# Patient Record
Sex: Female | Born: 1985 | Race: Black or African American | Hispanic: No | Marital: Single | Smoking: Current every day smoker
Health system: Southern US, Community
[De-identification: ages and names within clinical notes are randomized; demographics above are authoritative.]

## PROBLEM LIST (undated history)

## (undated) DIAGNOSIS — N809 Endometriosis, unspecified: Secondary | ICD-10-CM

## (undated) DIAGNOSIS — D649 Anemia, unspecified: Secondary | ICD-10-CM

## (undated) DIAGNOSIS — Z8669 Personal history of other diseases of the nervous system and sense organs: Secondary | ICD-10-CM

## (undated) DIAGNOSIS — M069 Rheumatoid arthritis, unspecified: Secondary | ICD-10-CM

## (undated) HISTORY — PX: OVARIAN CYST SURGERY: SHX726

## (undated) HISTORY — DX: Anemia, unspecified: D64.9

## (undated) HISTORY — PX: WISDOM TOOTH EXTRACTION: SHX21

## (undated) HISTORY — DX: Rheumatoid arthritis, unspecified: M06.9

## (undated) HISTORY — DX: Personal history of other diseases of the nervous system and sense organs: Z86.69

---
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
## 2006-02-19 ENCOUNTER — Emergency Department (HOSPITAL_COMMUNITY): Admission: EM | Admit: 2006-02-19 | Discharge: 2006-02-19 | Payer: Self-pay | Admitting: *Deleted

## 2006-02-28 ENCOUNTER — Emergency Department (HOSPITAL_COMMUNITY): Admission: EM | Admit: 2006-02-28 | Discharge: 2006-02-28 | Payer: Self-pay | Admitting: Family Medicine

## 2006-03-01 ENCOUNTER — Emergency Department (HOSPITAL_COMMUNITY): Admission: EM | Admit: 2006-03-01 | Discharge: 2006-03-01 | Payer: Self-pay | Admitting: Emergency Medicine

## 2006-03-02 ENCOUNTER — Inpatient Hospital Stay (HOSPITAL_COMMUNITY): Admission: AD | Admit: 2006-03-02 | Discharge: 2006-03-02 | Payer: Self-pay | Admitting: Gynecology

## 2006-05-27 ENCOUNTER — Inpatient Hospital Stay (HOSPITAL_COMMUNITY): Admission: AD | Admit: 2006-05-27 | Discharge: 2006-05-27 | Payer: Self-pay | Admitting: Obstetrics & Gynecology

## 2006-06-24 ENCOUNTER — Encounter (INDEPENDENT_AMBULATORY_CARE_PROVIDER_SITE_OTHER): Payer: Self-pay | Admitting: *Deleted

## 2007-03-03 ENCOUNTER — Ambulatory Visit (HOSPITAL_COMMUNITY): Admission: RE | Admit: 2007-03-03 | Discharge: 2007-03-03 | Payer: Self-pay | Admitting: Obstetrics and Gynecology

## 2007-09-01 ENCOUNTER — Emergency Department (HOSPITAL_COMMUNITY): Admission: EM | Admit: 2007-09-01 | Discharge: 2007-09-01 | Payer: Self-pay | Admitting: Emergency Medicine

## 2007-10-11 ENCOUNTER — Emergency Department (HOSPITAL_COMMUNITY): Admission: EM | Admit: 2007-10-11 | Discharge: 2007-10-11 | Payer: Self-pay | Admitting: Emergency Medicine

## 2008-05-31 ENCOUNTER — Other Ambulatory Visit: Admission: RE | Admit: 2008-05-31 | Discharge: 2008-05-31 | Payer: Self-pay | Admitting: Obstetrics and Gynecology

## 2009-09-28 ENCOUNTER — Emergency Department (HOSPITAL_COMMUNITY): Admission: EM | Admit: 2009-09-28 | Discharge: 2009-09-28 | Payer: Self-pay | Admitting: Family Medicine

## 2010-03-16 ENCOUNTER — Emergency Department (HOSPITAL_BASED_OUTPATIENT_CLINIC_OR_DEPARTMENT_OTHER): Admission: EM | Admit: 2010-03-16 | Discharge: 2010-03-17 | Payer: Self-pay | Admitting: Emergency Medicine

## 2010-05-13 LAB — CBC
HCT: 32.6 % — ABNORMAL LOW (ref 36.0–46.0)
Hemoglobin: 11.6 g/dL — ABNORMAL LOW (ref 12.0–15.0)
MCH: 29.4 pg (ref 26.0–34.0)
MCHC: 35.6 g/dL (ref 30.0–36.0)
MCV: 82.5 fL (ref 78.0–100.0)
Platelets: 243 10*3/uL (ref 150–400)
RBC: 3.95 MIL/uL (ref 3.87–5.11)
RDW: 13.8 % (ref 11.5–15.5)
WBC: 8.8 10*3/uL (ref 4.0–10.5)

## 2010-05-13 LAB — SURGICAL PCR SCREEN
MRSA, PCR: NEGATIVE
Staphylococcus aureus: NEGATIVE

## 2010-05-20 ENCOUNTER — Ambulatory Visit (HOSPITAL_COMMUNITY)
Admission: RE | Admit: 2010-05-20 | Discharge: 2010-05-20 | Payer: Self-pay | Source: Home / Self Care | Attending: Obstetrics and Gynecology | Admitting: Obstetrics and Gynecology

## 2010-05-20 LAB — HCG, QUANTITATIVE, PREGNANCY: hCG, Beta Chain, Quant, S: <2 m[IU]/mL (ref <5)

## 2010-06-03 NOTE — H&P (Signed)
[name], [name]             ACCOUNT NO.:  [id number]  MEDICAL RECORD NO.:  [id number]        PATIENT TYPE:  WAMB  LOCATION:                                [facility]:  [facility]  PHYSICIAN:  Lovetta Condie A. Jodel Mayhall, M.D. DATE OF BIRTH:  16-Apr-1986  DATE OF ADMISSION:  05/20/2010 DATE OF DISCHARGE:                             HISTORY & PHYSICAL   HISTORY OF PRESENT ILLNESS:  [name] is a 25 year old single black female, para 1-0-0-1, presenting for a laparoscopic ovarian cystectomy with the possibility of oophorectomy because of persistent pelvic pain and 2 complex ovarian cysts seen on ultrasound in the left adnexa.  For the past 9 months the patient reports daily crampy pelvic pain that on occasion will be sharp and at other times burning in sensation.  There are times when the intensity increases particularly  when she coughs or sneezes rating her pain as 10/10 on a 10-point pain scale. She found some relief, early on, with ibuprofen 800 mg that decreased her pain to 6/10 on a 10-point pain scale.  The patient reports that in the past she has been told she had endometriosis but has not had any previous surgery to document that fact.  She denies any urinary tract symptoms, changes in her bowel movements, vaginitis symptoms or fever.  A pelvic ultrasound in November 2011, showed a uterus measuring 9.43 x 5.69 x 5.31 cm with 2 the left ovarian cysts, the first appearing hemorrhagic measuring 5.29 x 4.64 x 4.66 cm, and the second a simple cyst measuring 3.63 x 3.10 x 3.46 cm.  The patient was also observed to have a right ovarian follicle on that study measuring 2.29 x 1.91 x 2.26 cm.  The patient was prescribed in November 2011, Toradol 10 mg for her pain.  She returned for followup visit in January 2012, to say that her pain remained the same though it was relieved for approximately 6 hours whenever she would take the Toradol.  A repeat ultrasound at that time showed that the  previously identified hemorrhagic cyst now had the appearance of a dermoid measuring 5.93 x 3.74 x 4.93 cm and the cyst had previously appeared simple, now had the appearance of an endometrioma measuring 4.83 x 3.04 x 4.07 cm.  The patient's right ovary was normal on this study.  Additionally, there was no increase blood flow with color flow Doppler to either ovary.  A review of management options were given to the patient regarding her ultrasound findings as well as pain management.  However, she has decided due to the intensity of her pain and the persistence of the  ovarian cysts to proceed with operative intervention.  PAST MEDICAL HISTORY:  OBSTETRICAL HISTORY:  Gravida 1, para 1-0-0-1.  GYNECOLOGICAL HISTORY:  Menarche at 12 years.  Last menstrual period on May 04, 2010.  The patient uses Camila for contraception.  She has a remote history of Chlamydia.  No history of abnormal Pap smears.  Last normal Pap smear was in September 2011.  MEDICAL HISTORY:  Migraines and anemia.  SURGICAL HISTORY:  Negative.  FAMILY HISTORY:  Colon cancer, hypertension, stroke, migraines, and thyroid  disease.  SOCIAL HISTORY:  The patient is single.  HABITS:  She smokes one half-pack of cigarettes per day.  Denies any alcohol or illicit drug use.  CURRENT MEDICATIONS: 1. Toradol 10 mg every 6 hours as needed for pain. 2. Camila 1 tablet daily.  ALLERGIES:  She has no known drug allergies.  REVIEW OF SYSTEMS:  The patient denies any chest pain, shortness of breath, vision changes, headache (except with migraines), myalgias, arthralgias, skin rashes and except as is mentioned in history of present illness, the patient's review of systems is otherwise negative.  PHYSICAL EXAMINATION:  VITAL SIGNS:  Blood pressure is 118/68, pulse is 80, weight is 148 pounds, and height 5 feet 5 inches tall. HEENT:  Head and eyes, pupils are equal.  ENT, hearing is normal. Throat is clear. NECK:  Supple  without masses.  There is no thyromegaly or cervical adenopathy. HEART:  Regular rate and rhythm. LUNGS:  Clear. BACK:  No CVA tenderness. ABDOMEN:  No tenderness, masses, or organomegaly. EXTREMITIES:  No clubbing, cyanosis, or edema. PELVIC:  EGBUS is normal.  Vagina is normal.  Cervix is nontender without lesions.  Uterus appears normal size, shape, and consistency without tenderness.  Adnexa without tenderness or masses.  IMPRESSION: 1. Persistent pelvic pain. 2. Persistent complex left ovarian cyst.  DISPOSITION:  A discussion was held with the patient regarding indications for her procedure along with its risks which include but are not limited to reaction to anesthesia, damage to adjacent organs, infection and excessive bleeding.  The patient further understands that there is a possibility that her ovary may need to be removed in order to alleviate her persistent findings.  The patient was given a bowel prep to be completed 24 hours prior to her procedure.  She has consented to proceed with operative laparoscopy with ovarian cystectomy and the possibility of oophorectomy at [facility] on May 20, 2010 at 10 o'clock a.m.     Elmira J. Lowell Guitar, P.A.-C   ______________________________ Pierre Bali Normand Sloop, M.D.    EJP/MEDQ  D:  05/18/2010  T:  05/19/2010  Job:  [id number]  Electronically Signed by Henreitta Leber P.A. on 05/19/2010 06:03:08 PM Electronically Signed by Jaymes Graff M.D. on 06/03/2010 10:16:19 AM

## 2010-06-03 NOTE — Op Note (Signed)
NAMEJAIDYN, USERY             ACCOUNT NO.:  [id number]  MEDICAL RECORD NO.:  [id number]          PATIENT TYPE:  AMB  LOCATION:  SDC                           [facility]ILITY:  [facility]  PHYSICIAN:  Satoshi Kalas A. Zael Shuman, M.D. DATE OF BIRTH:  [birth year]  DATE OF PROCEDURE:  05/20/2010 DATE OF DISCHARGE:  05/20/2010                              OPERATIVE REPORT   PREOPERATIVE DIAGNOSES:  Pelvic pain and complex persistent left ovarian cyst.  POSTOPERATIVE DIAGNOSES:  Pelvic pain left and right, lower bilateral ovarian cyst, dense pelvic adhesions and endometriosis.  PROCEDURES:  Operative laparoscopy with bilateral ovarian cystectomy, biopsy of peritoneum, ablation of endometriosis, lysis of adhesions and cystoscopy.  SURGEON:  Jadon Ressler A. Jahanna Raether, MD  ASSISTANTDorthula Perfect PA  ANESTHESIA:  General and local.  FINDINGS:  Bilateral hydrosalpinges with bilateral ovarian cyst which were consistent with endometrioma, dense pelvic abdominal adhesions, multiple pelvic and right abdominal wall and liver implants consistent with endometriosis.  SPECIMEN:  Peritoneal biopsies, right and left ovarian cyst wall. Specimens sent to Pathology.  ESTIMATED BLOOD LOSS:  150 mL.  URINE OUTPUT:  200 mL.  IV FLUIDS:  2300 mL of crystalloid.  COMPLICATIONS:  Were none.  The patient went to PACU in stable condition.  PROCEDURE IN DETAIL:  The patient was taken to the operating room where she was given general anesthesia, prepped and draped in a normal sterile fashion.  A bivalve speculum was placed into the vagina.  The anterior lip of the cervix was grasped with single-tooth tenaculum.  An acorn manipulator was placed into the cervical canal and attached to the tenaculum as a means to manipulate the uterus.  Attention was then turned to the umbilicus where 5 mL of 0.25% Marcaine was used for anesthesia and 10-mm infraumbilical incision was made with a scalpel and carried down to the fascia.  The  fascia was incised in the midline and extended bilaterally.  The peritoneum was identified, tented up, and entered sharply.  A 0 Vicryl circumferential suture was used around the fascia.  The Hasson was placed.  The balloon was blown up and CO2 gas was used to insufflate the abdomen.  The findings noted above were seen. Two 5-mm trocars were placed in direct visualization of the laparoscope. The patient's right ovary was dissected out of the posterior cul-de-sac by dissecting the adhesions both sharply with scissors and bluntly using the probe and there was a large endometrioma present.  Chocolate cyst fluid was seen to escape the ovary.  This was irrigated and removed from the pelvis.  The ovarian cyst wall was then opened and the ovarian cyst wall was then removed with traction, counter traction.  The ovarian bed was made hemostatic with Kleppinger.  Attention was then turned to the patient's left ovary.  She had a large left hydrosalpinx, but no evidence of a ovarian abscess and a large left ovarian cyst.  Once opened it also was filled with chocolate fluid consistent with an endometrioma which was drained with a Nezhat.  The ovarian cyst wall was removed with counter traction and traction.  The cyst wall was made hemostatic with Kleppinger.  Attention was then turned to the patient's left side wall and just above the bladder and along the uterus where endometrial implants were removed with biopsy forceps and the areas that were bleeding were made hemostatic.  Attention was then turned to the right sidewall and anterior abdominal wall with a powder burn lesions. These were fulgurated with Kleppinger.  She also had some at the upper right side wall just above where the appendix where the appendiceal fossa lies and these were also fulgurated.  She also had some adhesions near the liver and some areas consistent with endometriosis.  I did not attempt to remove or fulgurate this area as  because it was near the liver which is very hemorrhagic.  I then looked at both ovarian beds which were hemostatic, but ovary had lysis of adhesions in the posterior fossa and the right sidewall.  On left side wall, there was some oozing from the peritoneum and just bleeding.  I tried to use Gelfoam and that was not successful.  I did fulgurate that area on the left sidewall and cul-de-sac.  I was not able to see the ureter. I tried to see superficial, but I did use Kleppinger very superficial to help stop the bleeding.  It continued to bleed.  We then used Surgicel on this area and it did help, but there was still some oozing.  At this point, we put Surgiflo with the foamed thrombin and put on that area.  The left side wall and posterior cul-de-sac was also bleeding on the left side, where adhesions were removed.  Ovary had taken out of that fossa and again Surgicel and the Surgiflo gel was used.  The FloSeal was also used to gain hemostasis.  All the gas was allowed to leave the abdomen and we did not insufflate for about a minute after insufflating.  The area was still hemostatic.  Before even placing the FloSeal, large irrigation was done and on the second look after letting the air leak the abdomen, areas were all hemostatic and ordered to obtain hemostasis.  Another 5- mm port was placed in the suprapubic area 2-cm above the symphysis pubis in direct visualization of the laparoscope for a total of 4 laparoscopic ports. Once the area was noted to be hemostatic, all instruments were removed from the abdomen under direct visualization of the laparoscope. The umbilical fascia was reapproximated by closing the circumferential incision.  There was still a large defect on to the right.  This was still a figure-of-eight 0 Vicryl was placed in the fascia.  Hemostasis was assured.  A 3-0 Monocryl was used in subcuticular fashion to close the skin.  The remaining skin incision was closed with  Dermabond. Because I did some fulgurating on that right side wall and could not delineate the ureter, I was very concerned.  I did cystoscopy with indigo carmine.  Both ureters were seen to efflux.  We will monitor the patient closely over the next week.  Sponge, lap and needle counts were correct.  The patient taken to recovery room in stable condition.     Lajoy Vanamburg A. Normand Sloop, M.D.     NAD/MEDQ  D:  05/20/2010  T:  05/21/2010  Job:  [id number]  Electronically Signed by Jaymes Graff M.D. on 06/03/2010 10:17:31 AM

## 2010-07-13 LAB — HEPATITIS PANEL, ACUTE
HCV Ab: NEGATIVE
Hep A IgM: NEGATIVE
Hep B C IgM: NEGATIVE
Hepatitis B Surface Ag: NEGATIVE

## 2010-07-13 LAB — HIV ANTIBODY (ROUTINE TESTING W REFLEX): HIV: NONREACTIVE

## 2010-09-11 NOTE — H&P (Signed)
NAMEKEYRA, VIRELLA                       ACCOUNT NO.:  [id number]   MEDICAL RECORD NO.:  [id number]                   PATIENT TYPE:  INP   LOCATION:  9164                                 [facility]ILITY:  [facility]   PHYSICIAN:  Crist Fat. Rivard, M.D.              DATE OF BIRTH:  10-21-1985   DATE OF ADMISSION:  10/22/2003  DATE OF DISCHARGE:                                HISTORY & PHYSICAL   [name] is a 25 year old single black female, primigravida, at 38-5/7  weeks, who presents with regular uterine contractions tonight.  She denies  leaking, bleeding, headache, nausea and vomiting, and visual disturbances.  Her pregnancy has been [facility]  and has been remarkable for:   1. Adolescent.  2. Hemoglobin AC.  3. Group B strep negative.   Her prenatal labs were collected on April 05, 2003.  Hemoglobin 11.1,  hematocrit 31.6, platelets 324,000.  Blood type A positive, antibody  negative.  RPR nonreactive.  Rubella immune.  Hepatitis B surface antigen  negative.  Pap smear from June 2004 was within normal limits.  Gonorrhea  negative.  Chlamydia negative.  One-hour Glucola on July 26, 2003, was  within normal limits.  A hemoglobin at that time was 11.5.  Culture of the  vaginal tract for group B strep on October 01, 2003, was negative.   HISTORY OF PRESENT PREGNANCY:  She presented for care at [facility] on  April 05, 2003, at 10 weeks' gestation.  Pregnancy ultrasonography at 18  weeks' gestation shows growth consistent with last menstrual period dating.  Her quad screen was normal at that time.  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  The patient  states that father of the baby does not have sickle cell trait based on his  testing, but that is per her report only.  She had some back pain in the  third trimester.  The rest of her prenatal care has been unremarkable.   PAST  OBSTETRICAL HISTORY:  She is a primigravida.   PAST MEDICAL HISTORY:  She has no medication allergies.  She reports having  had the usual childhood illnesses.  She has used oral contraceptives in the  past and stopped in January 2004.  She was treated for Chlamydia in June  2004.  She had cystitis in 2001.   PAST SURGICAL HISTORY:  Remarkable for wisdom teeth extraction in November  2003.   FAMILY MEDICAL HISTORY:  Remarkable for maternal grandmother with myocardial  infarction,  Paternal aunt and maternal aunt with anemia.  Maternal  grandmother with history of stroke and migraines.  Multiple family members  are smokers.   GENETIC HISTORY:  Negative.   SOCIAL HISTORY:  The patient is single.  Father of the baby's name is Visual merchandiser.  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  Father of the  baby has 13 years of education and works full-time in [employer].  They  deny any alcohol, tobacco, or illicit drug use with the pregnancy.   OBJECTIVE DATA:  VITAL SIGNS:  Stable.  She is afebrile.  HEENT:  Grossly within normal limits.  CHEST:  Clear to auscultation.  CARDIAC:  Regular rate and rhythm.  ABDOMEN:  Gravid and __________ height to be approximately 38 cm above the  pubic symphysis.  Fetal heart rate is reassuring.  Uterine contractions  every two minutes.  PELVIC:  Cervical exam initially was 3 cm and then approximately half an  hour later was 5 cm, 90%, vertex, -1, with intact bag of waters, with  positive bloody show.  Patient involuntarily bearing down.  EXTREMITIES:  Within normal limits.   ASSESSMENT:  1. Intrauterine pregnancy at term.  2. Active labor.   PLAN:  1. Admit to birthing suites per consult with Dr. Estanislado Pandy.  2. Routine M.D. orders.  3. Epidural.     Cam Hai, C.N.M.                     Crist Fat Rivard, M.D.    KS/MEDQ  D:  10/22/2003  T:  10/22/2003  Job:  [id number]

## 2010-11-18 ENCOUNTER — Encounter: Payer: Self-pay | Admitting: *Deleted

## 2010-11-18 ENCOUNTER — Emergency Department (INDEPENDENT_AMBULATORY_CARE_PROVIDER_SITE_OTHER): Payer: Managed Care, Other (non HMO)

## 2010-11-18 ENCOUNTER — Emergency Department (HOSPITAL_BASED_OUTPATIENT_CLINIC_OR_DEPARTMENT_OTHER)
Admission: EM | Admit: 2010-11-18 | Discharge: 2010-11-18 | Disposition: A | Discharge status: Home / Self Care | Payer: Managed Care, Other (non HMO) | Attending: Emergency Medicine | Admitting: Emergency Medicine

## 2010-11-18 DIAGNOSIS — F17200 Nicotine dependence, unspecified, uncomplicated: Secondary | ICD-10-CM | POA: Insufficient documentation

## 2010-11-18 DIAGNOSIS — R079 Chest pain, unspecified: Secondary | ICD-10-CM

## 2010-11-18 MED ORDER — NAPROXEN 500 MG PO TABS: 500.0000 mg | ORAL_TABLET | Freq: Two times a day (BID) | ORAL | Status: DC

## 2010-11-18 MED ORDER — HYDROCODONE-ACETAMINOPHEN 5-325 MG PO TABS
ORAL_TABLET | ORAL | Status: AC
  Administered 2010-11-18: 1 via ORAL
  Filled 2010-11-18: qty 1

## 2010-11-18 MED ORDER — OMEPRAZOLE 20 MG PO CPDR: 20.0000 mg | DELAYED_RELEASE_CAPSULE | Freq: Every day | ORAL | Status: DC

## 2010-11-18 MED ORDER — OXYCODONE-ACETAMINOPHEN 5-325 MG PO TABS
ORAL_TABLET | ORAL | Status: AC
  Filled 2010-11-18: qty 1

## 2010-11-18 MED ORDER — HYDROCODONE-ACETAMINOPHEN 5-325 MG PO TABS
1.0000 | ORAL_TABLET | Freq: Once | ORAL | Status: AC
  Administered 2010-11-18: 1 via ORAL

## 2010-11-18 MED ORDER — HYDROCODONE-ACETAMINOPHEN 5-325 MG PO TABS: 2.0000 | ORAL_TABLET | ORAL | Status: AC | PRN

## 2010-11-18 NOTE — ED Provider Notes (Signed)
History     Chief Complaint  Patient presents with  . Chest Pain    after receiving lupon injection started having some discomfort in chest states it does get tighter depending on the positon she happens to be in denies shortness of breath   Patient is a 25 y.o. female presenting with chest pain. The history is provided by the patient.  Chest Pain The chest pain began yesterday. Chest pain occurs constantly. The chest pain is unchanged. Associated with: Now worse with breathing. The severity of the pain is moderate. The quality of the pain is described as aching. The pain does not radiate. Chest pain is worsened by certain positions. Pertinent negatives for primary symptoms include no fever, no fatigue, no syncope, no shortness of breath, no wheezing, no palpitations, no abdominal pain, no nausea and no dizziness.  Pertinent negatives for associated symptoms include no claudication, no diaphoresis, no numbness and no orthopnea. Treatments tried: H. and tried TUMS without relief. Risk factors include no known risk factors (No history of coronary artery disease no history of PE or DVT. Has been no recent trips or travel. No family history of cardiac or pulmonary disease.).     History reviewed. No pertinent past medical history.  Past Surgical History  Procedure Date  . Ovarian cyst surgery     History reviewed. No pertinent family history.  History  Substance Use Topics  . Smoking status: Current Everyday Smoker -- 0.5 packs/day  . Smokeless tobacco: Never Used  . Alcohol Use: Yes     occ    OB History    Grav Para Term Preterm Abortions TAB SAB Ect Mult Living                  Review of Systems  Constitutional: Negative for fever, diaphoresis and fatigue.  Respiratory: Negative for shortness of breath and wheezing.   Cardiovascular: Positive for chest pain. Negative for palpitations, orthopnea, claudication and syncope.  Gastrointestinal: Negative for nausea and abdominal  pain.  Neurological: Negative for dizziness and numbness.  All other systems reviewed and are negative.    Physical Exam  There were no vitals taken for this visit.  Physical Exam  Constitutional: She appears well-developed and well-nourished. No distress.  HENT:  Head: Normocephalic and atraumatic.  Right Ear: External ear normal.  Left Ear: External ear normal.  Eyes: Conjunctivae are normal. Right eye exhibits no discharge. Left eye exhibits no discharge. No scleral icterus.  Neck: Neck supple. No tracheal deviation present.  Cardiovascular: Normal rate, regular rhythm and intact distal pulses.   Pulmonary/Chest: Effort normal and breath sounds normal. No stridor. No respiratory distress. She has no wheezes. She has no rales.  Abdominal: Soft. Bowel sounds are normal. She exhibits no distension. There is no tenderness. There is no rebound and no guarding.  Musculoskeletal: She exhibits no edema and no tenderness.       No cords no calf tenderness normal pulses throughout  Neurological: She is alert. She has normal strength. No sensory deficit. Cranial nerve deficit:  no gross defecits noted. She exhibits normal muscle tone. She displays no seizure activity. Coordination normal.  Skin: Skin is warm and dry. No rash noted.  Psychiatric: She has a normal mood and affect.    ED Course  Procedures  Date: 11/18/2010  Rate: 66  Rhythm: normal sinus rhythm and sinus arrhythmia  QRS Axis: normal  Intervals: normal  ST/T Wave abnormalities: normal  Conduction Disutrbances:none  Narrative Interpretation: normal ekg  Old EKG Reviewed: none available  Chest x-ray did not show any signs of pneumonia pneumothorax or other significant pathology.   MDM There doesn't appear to be any signs of acute coronary disease. Patient is very low risk for this. Her EKG is normal the pain is atypical. I do not feel this point is any further evaluation necessary regarding this. There does not appear to  be any signs of pneumonia pneumothorax. Pulmonary embolism is unlikely patient is low risk. The patient did recently get a Lupron injection do not feel that this likely related to her symptoms. Could be a case of GERD.  Viral pericarditis is a possibility as well although she has not had any viral prodromal symptoms and there are no EKG findings to suggest that. We'll try a course of NSAIDs and Prilosec OTC. Follow up with her doctor in a week if symptoms have not resolved. Warning signs will be discussed in her discharge instructions.      Celene Kras, MD 11/18/10 [phone]

## 2011-01-20 LAB — URINALYSIS, ROUTINE W REFLEX MICROSCOPIC
Bilirubin Urine: NEGATIVE
Glucose, UA: NEGATIVE
Hgb urine dipstick: NEGATIVE
Ketones, ur: NEGATIVE
Nitrite: NEGATIVE
Protein, ur: NEGATIVE
Specific Gravity, Urine: 1.019
Urobilinogen, UA: 1
pH: 7.5

## 2011-01-20 LAB — BASIC METABOLIC PANEL
BUN: 5 — ABNORMAL LOW
CO2: 29
Calcium: 9
Chloride: 106
Creatinine, Ser: 0.7
GFR calc Af Amer: >60
GFR calc non Af Amer: >60
Glucose, Bld: 86
Potassium: 3.7
Sodium: 140

## 2011-01-20 LAB — CBC
HCT: 36.4
Hemoglobin: 12.7
MCHC: 34.7
MCV: 85.4
Platelets: 272
RBC: 4.26
RDW: 14.1
WBC: 10

## 2011-01-20 LAB — GC/CHLAMYDIA PROBE AMP, GENITAL
Chlamydia, DNA Probe: NEGATIVE
GC Probe Amp, Genital: NEGATIVE

## 2011-01-20 LAB — URINE MICROSCOPIC-ADD ON

## 2011-01-20 LAB — WET PREP, GENITAL
Clue Cells Wet Prep HPF POC: NONE SEEN
Trich, Wet Prep: NONE SEEN
WBC, Wet Prep HPF POC: NONE SEEN
Yeast Wet Prep HPF POC: NONE SEEN

## 2011-01-20 LAB — DIFFERENTIAL
Basophils Absolute: 0
Basophils Relative: 0
Eosinophils Absolute: 0.2
Eosinophils Relative: 2
Lymphocytes Relative: 18
Lymphs Abs: 1.7
Monocytes Absolute: 0.5
Monocytes Relative: 5
Neutro Abs: 7.5
Neutrophils Relative %: 76

## 2011-01-20 LAB — PREGNANCY, URINE: Preg Test, Ur: NEGATIVE

## 2011-04-27 DIAGNOSIS — M069 Rheumatoid arthritis, unspecified: Secondary | ICD-10-CM

## 2011-04-27 HISTORY — DX: Rheumatoid arthritis, unspecified: M06.9

## 2011-07-16 ENCOUNTER — Encounter (INDEPENDENT_AMBULATORY_CARE_PROVIDER_SITE_OTHER): Payer: Managed Care, Other (non HMO) | Admitting: Registered Nurse

## 2011-07-16 DIAGNOSIS — R300 Dysuria: Secondary | ICD-10-CM

## 2011-07-16 DIAGNOSIS — N760 Acute vaginitis: Secondary | ICD-10-CM

## 2011-08-10 ENCOUNTER — Other Ambulatory Visit: Payer: Self-pay

## 2011-09-16 ENCOUNTER — Telehealth: Payer: Self-pay | Admitting: Obstetrics and Gynecology

## 2011-09-16 NOTE — Telephone Encounter (Signed)
PT RTND CALL, STATES HAS BEEN HAVING SOME VAGINAL ITCHING, THINKS MAY HAVE A YEAST INFECTION, PT OFFERED APPT, PT SCHED FOR EVAL TOMORROW 09/17/11 @ 1100 W/ EP.

## 2011-09-16 NOTE — Telephone Encounter (Signed)
TRIAGE/EPIC °

## 2011-09-16 NOTE — Telephone Encounter (Signed)
TC TO PT REGARDING MSG, LM ON VM TO CALL BACK. 

## 2011-09-17 ENCOUNTER — Encounter: Payer: Self-pay | Admitting: Obstetrics and Gynecology

## 2011-09-17 ENCOUNTER — Ambulatory Visit (INDEPENDENT_AMBULATORY_CARE_PROVIDER_SITE_OTHER): Payer: Commercial Indemnity | Admitting: Obstetrics and Gynecology

## 2011-09-17 VITALS — BP 100/70 | Temp 99.0°F | Ht 64.0 in | Wt 154.0 lb

## 2011-09-17 DIAGNOSIS — M069 Rheumatoid arthritis, unspecified: Secondary | ICD-10-CM | POA: Insufficient documentation

## 2011-09-17 DIAGNOSIS — B373 Candidiasis of vulva and vagina: Secondary | ICD-10-CM

## 2011-09-17 DIAGNOSIS — Z8669 Personal history of other diseases of the nervous system and sense organs: Secondary | ICD-10-CM | POA: Insufficient documentation

## 2011-09-17 DIAGNOSIS — N912 Amenorrhea, unspecified: Secondary | ICD-10-CM

## 2011-09-17 LAB — POCT WET PREP (WET MOUNT)

## 2011-09-17 MED ORDER — FLUCONAZOLE 150 MG PO TABS: 150.0000 mg | ORAL_TABLET | Freq: Once | ORAL | Status: AC

## 2011-09-17 NOTE — Progress Notes (Signed)
Color: BROWN Odor: no Itching:yes Thin:yes Thick:yes Fever:no Dyspareunia:no Hx PID:no HX STD:no Pelvic Pain:no Desires Gc/CT:no Desires HIV,RPR,HbsAG:no

## 2011-09-17 NOTE — Progress Notes (Signed)
25 YO with vaginal discharge x 1 week with itching. Recently diagnosed with  rheumatoid arthritis.  Last Depo Provera January 2013.     O:  Wet Prep: ph 5.0 whiff-negative, yeast +       UPT: negative  Pelvic: EGBUS-wnl, vagina-moderate white discharge, cervix-no lesions, uterus-normal size, adnexae-no tenderness or masses   A: Yeast Vaginitis     Amenorrhea     H/O Depo Provera   P: Perineal Hygiene     Diflucan 150 mg #1 1 po stat  1 rf     RTO-AEx or prn   Derinda Bartus, PA-C

## 2011-09-17 NOTE — Patient Instructions (Signed)
Avoid: - excess soap on genital area (consider using plain oatmeal soap) - use of powder or sprays in genital area - douching - wearing underwear to bed (except with menses) - using more than is directed detergent when washing clothes - tight fitting garments around genital area - excess sugar intake   

## 2011-11-02 ENCOUNTER — Encounter: Payer: Self-pay | Admitting: Pharmacist

## 2011-11-02 ENCOUNTER — Ambulatory Visit (INDEPENDENT_AMBULATORY_CARE_PROVIDER_SITE_OTHER): Payer: Self-pay | Admitting: Pharmacist

## 2011-11-02 VITALS — BP 110/81 | HR 81 | Ht 64.5 in | Wt 154.0 lb

## 2011-11-02 DIAGNOSIS — M069 Rheumatoid arthritis, unspecified: Secondary | ICD-10-CM

## 2011-11-02 NOTE — Patient Instructions (Signed)
Best of luck with staying quit from tobacco.   Humira can be started as soon as you can pick up from the pharmacy.

## 2011-11-02 NOTE — Progress Notes (Signed)
  Subjective:    Patient ID: Heather Washington, female    DOB: [birth year], 26 y.o.   MRN: [id number]  HPI  Patient arrives in good spirits for medication review.  Reports seeing  Dr. Orlin Hilding as rheumatologist and GYN- Dr. Normand Sloop. With [facility].   Reports being diagnosed with RA since April 2013 and is about to be started on Humira.      Review of Systems     Objective:   Physical Exam        Assessment & Plan:  Following medication review, no suggestions for change.  Patient to start Humira for RA in the next few days.  Complete medication list provided to patient.  Total time in face to face medication review: 25 minutes.  Patient seen with: Murtis Sink, MD Resident

## 2011-11-02 NOTE — Progress Notes (Signed)
Patient ID: Heather Washington, female   DOB: [birth year], 26 y.o.   MRN: [id number] Reviewed and agree with Dr. Macky Lower management and documentation.

## 2011-11-02 NOTE — Assessment & Plan Note (Signed)
Following medication review, no suggestions for change.  Patient to start Humira for RA in the next few days.  Complete medication list provided to patient.  Total time in face to face medication review: 25 minutes.  Patient seen with: Murtis Sink, MD Resident

## 2012-05-12 ENCOUNTER — Encounter: Payer: Self-pay | Admitting: Obstetrics and Gynecology

## 2012-05-12 ENCOUNTER — Ambulatory Visit: Payer: 59 | Admitting: Obstetrics and Gynecology

## 2012-05-12 VITALS — BP 102/70 | Temp 98.4°F | Wt 147.0 lb

## 2012-05-12 DIAGNOSIS — N809 Endometriosis, unspecified: Secondary | ICD-10-CM | POA: Insufficient documentation

## 2012-05-12 MED ORDER — ONDANSETRON HCL 4 MG PO TABS: 4.0000 mg | ORAL_TABLET | Freq: Three times a day (TID) | ORAL | Status: DC | PRN

## 2012-05-12 MED ORDER — HYDROCODONE-ACETAMINOPHEN 5-500 MG PO TABS: 1.0000 | ORAL_TABLET | Freq: Four times a day (QID) | ORAL | Status: DC | PRN

## 2012-05-12 NOTE — Patient Instructions (Addendum)
Contraception Choices  Contraception (birth control) is the use of any methods or devices to prevent pregnancy. Below are some methods to help avoid pregnancy.  HORMONAL METHODS   · Contraceptive implant. This is a thin, plastic tube containing progesterone hormone. It does not contain estrogen hormone. Your caregiver inserts the tube in the inner part of the upper arm. The tube can remain in place for up to 3 years. After 3 years, the implant must be removed. The implant prevents the ovaries from releasing an egg (ovulation), thickens the cervical mucus which prevents sperm from entering the uterus, and thins the lining of the inside of the uterus.  · Progesterone-only injections. These injections are given every 3 months by your caregiver to prevent pregnancy. This synthetic progesterone hormone stops the ovaries from releasing eggs. It also thickens cervical mucus and changes the uterine lining. This makes it harder for sperm to survive in the uterus.  · Birth control pills. These pills contain estrogen and progesterone hormone. They work by stopping the egg from forming in the ovary (ovulation). Birth control pills are prescribed by a caregiver. Birth control pills can also be used to treat heavy periods.  · Minipill. This type of birth control pill contains only the progesterone hormone. They are taken every day of each month and must be prescribed by your caregiver.  · Birth control patch. The patch contains hormones similar to those in birth control pills. It must be changed once a week and is prescribed by a caregiver.  · Vaginal ring. The ring contains hormones similar to those in birth control pills. It is left in the vagina for 3 weeks, removed for 1 week, and then a new one is put back in place. The patient must be comfortable inserting and removing the ring from the vagina. A caregiver's prescription is necessary.  · Emergency contraception. Emergency contraceptives prevent pregnancy after unprotected  sexual intercourse. This pill can be taken right after sex or up to 5 days after unprotected sex. It is most effective the sooner you take the pills after having sexual intercourse. Emergency contraceptive pills are available without a prescription. Check with your pharmacist. Do not use emergency contraception as your only form of birth control.  BARRIER METHODS   · Female condom. This is a thin sheath (latex or rubber) that is worn over the penis during sexual intercourse. It can be used with spermicide to increase effectiveness.  · Female condom. This is a soft, loose-fitting sheath that is put into the vagina before sexual intercourse.  · Diaphragm. This is a soft, latex, dome-shaped barrier that must be fitted by a caregiver. It is inserted into the vagina, along with a spermicidal jelly. It is inserted before intercourse. The diaphragm should be left in the vagina for 6 to 8 hours after intercourse.  · Cervical cap. This is a round, soft, latex or plastic cup that fits over the cervix and must be fitted by a caregiver. The cap can be left in place for up to 48 hours after intercourse.  · Sponge. This is a soft, circular piece of polyurethane foam. The sponge has spermicide in it. It is inserted into the vagina after wetting it and before sexual intercourse.  · Spermicides. These are chemicals that kill or block sperm from entering the cervix and uterus. They come in the form of creams, jellies, suppositories, foam, or tablets. They do not require a prescription. They are inserted into the vagina with an applicator before having sexual intercourse.   The process must be repeated every time you have sexual intercourse.  INTRAUTERINE CONTRACEPTION  · Intrauterine device (IUD). This is a T-shaped device that is put in a woman's uterus during a menstrual period to prevent pregnancy. There are 2 types:  · Copper IUD. This type of IUD is wrapped in copper wire and is placed inside the uterus. Copper makes the uterus and  fallopian tubes produce a fluid that kills sperm. It can stay in place for 10 years.  · Hormone IUD. This type of IUD contains the hormone progestin (synthetic progesterone). The hormone thickens the cervical mucus and prevents sperm from entering the uterus, and it also thins the uterine lining to prevent implantation of a fertilized egg. The hormone can weaken or kill the sperm that get into the uterus. It can stay in place for 5 years.  PERMANENT METHODS OF CONTRACEPTION  · Female tubal ligation. This is when the woman's fallopian tubes are surgically sealed, tied, or blocked to prevent the egg from traveling to the uterus.  · Female sterilization. This is when the female has the tubes that carry sperm tied off (vasectomy). This blocks sperm from entering the vagina during sexual intercourse. After the procedure, the man can still ejaculate fluid (semen).  NATURAL PLANNING METHODS  · Natural family planning. This is not having sexual intercourse or using a barrier method (condom, diaphragm, cervical cap) on days the woman could become pregnant.  · Calendar method. This is keeping track of the length of each menstrual cycle and identifying when you are fertile.  · Ovulation method. This is avoiding sexual intercourse during ovulation.  · Symptothermal method. This is avoiding sexual intercourse during ovulation, using a thermometer and ovulation symptoms.  · Post-ovulation method. This is timing sexual intercourse after you have ovulated.  Regardless of which type or method of contraception you choose, it is important that you use condoms to protect against the transmission of sexually transmitted diseases (STDs). Talk with your caregiver about which form of contraception is most appropriate for you.  Document Released: 04/12/2005 Document Revised: 07/05/2011 Document Reviewed: 08/19/2010  [employer]® Patient Information ©2013 [employer].

## 2012-05-12 NOTE — Progress Notes (Signed)
Date pain started: Pt has pain approx 1 week before her period.  She stopped her depo provera and has developed pain.  She has a history of endometriosis Pain scale: 8/10 Imaging: n/a Nausea,Vomiting,Fever,chills: no  UTI SX: none H/O kidney stones: no  What makes pain better/worse: Period makes it worse Sexually active: yes Pt thinks she may have an ovarian cyst. BP 102/70  Temp 98.4 F (36.9 C) (Oral)  Wt 147 lb (66.679 kg)  LMP 04/29/2012 Physical Examination: General appearance - alert, well appearing, and in no distress Abdomen - soft, nontender, nondistended, no masses or organomegaly Pelvic - normal external genitalia, vulva, vagina, cervix, uterus and adnexa Endometriosis with CPP Pt stopped Depo provera because of her rhematoid arthritis.  She will meet with her Rheumatologist and discuss hormonal suppression of menses for her pain Cervical cxs done.  US@[place]  AEX @[place] 

## 2012-05-14 LAB — GC/CHLAMYDIA PROBE AMP: GC Probe RNA: NEGATIVE

## 2012-06-08 ENCOUNTER — Ambulatory Visit: Payer: 59 | Admitting: Obstetrics and Gynecology

## 2012-06-08 ENCOUNTER — Other Ambulatory Visit: Payer: 59

## 2012-06-10 ENCOUNTER — Other Ambulatory Visit: Payer: Self-pay

## 2012-06-27 ENCOUNTER — Telehealth: Payer: Self-pay | Admitting: Obstetrics and Gynecology

## 2012-06-28 ENCOUNTER — Telehealth: Payer: Self-pay | Admitting: Obstetrics and Gynecology

## 2012-06-28 NOTE — Telephone Encounter (Signed)
Spoke with pt rgd concerns pt wants rx for vicodin for endometriosis pain advised pt need an appt offered pt an appt for 06/30/12 with ND pt declined appt states she will go to hospital

## 2012-06-29 ENCOUNTER — Telehealth: Payer: Self-pay | Admitting: Obstetrics and Gynecology

## 2012-06-29 ENCOUNTER — Other Ambulatory Visit: Payer: Self-pay | Admitting: Obstetrics and Gynecology

## 2012-06-29 MED ORDER — HYDROCODONE-ACETAMINOPHEN 5-325 MG PO TABS: 1.0000 | ORAL_TABLET | Freq: Four times a day (QID) | ORAL | Status: DC | PRN

## 2012-06-29 NOTE — Telephone Encounter (Signed)
Tc to pt ND response below.  Pt voices understanding to all discussed.  Rx for Vicodin called in to pt's pharmacy.

## 2012-06-29 NOTE — Telephone Encounter (Signed)
Message copied by Delon Sacramento on Thu Jun 29, 2012  9:11 AM ------      Message from: Jaymes Graff      Created: Wed Jun 28, 2012  7:13 PM      Regarding: RE: Rx       Pt may have vicodin 5/325 1 tablet every 6 hrs as needed for pain.  Disp # 30.  o refills.  Ask the pt what her rhuematologist suggested for her to be on to supress her menses.  She needs to see me to discuss this.  i can not keep giving her vicodin      ----- Message -----         From: Delon Sacramento, RN         Sent: 06/27/2012  11:49 AM           To: Michael Litter, MD      Subject: Rx                                                       Dr. ND, this pt is asking for a rx for her Vicodin.  She states that she never picked up the rx you had written her @ her last appt on 05/12/12.  She started her cycle and went to pick up the rx and they would not fill it b/c it is Vicodin 5-500.  If you do not want to fill the Vicodin she will take anything, she is in a lot of pain and Tylenol, Ibuprofen and Aleve are not helping.  What would you like to do?            Thanks, Donnamae Jude       ------

## 2012-07-04 ENCOUNTER — Other Ambulatory Visit: Payer: 59

## 2012-07-04 ENCOUNTER — Ambulatory Visit: Payer: 59 | Admitting: Obstetrics and Gynecology

## 2012-07-13 ENCOUNTER — Other Ambulatory Visit: Payer: Self-pay | Admitting: Obstetrics and Gynecology

## 2012-07-14 LAB — PAP IG W/ RFLX HPV ASCU

## 2012-07-17 ENCOUNTER — Telehealth: Payer: Self-pay

## 2012-07-17 NOTE — Telephone Encounter (Signed)
Message copied by Rolla Plate on Mon Jul 17, 2012 12:01 PM ------      Message from: Jaymes Graff      Created: Mon Jul 17, 2012  9:34 AM       Please tell pt BV was found on her pap smear.  She can be treated with either Metrogel one applicator in vagina QHS for five nights or Flagyl 500mg  one tablet twice a day for seven days. ------

## 2012-07-17 NOTE — Telephone Encounter (Signed)
Spoke with pt rgd msg informed pap wnl pt voice understanding

## 2012-07-17 NOTE — Telephone Encounter (Signed)
Lm on vm tcb rgd labs 

## 2013-02-11 ENCOUNTER — Emergency Department (HOSPITAL_COMMUNITY)
Admission: EM | Admit: 2013-02-11 | Discharge: 2013-02-12 | Disposition: A | Discharge status: Home / Self Care | Payer: Self-pay | Attending: Emergency Medicine | Admitting: Emergency Medicine

## 2013-02-11 ENCOUNTER — Encounter (HOSPITAL_COMMUNITY): Payer: Self-pay | Admitting: Emergency Medicine

## 2013-02-11 DIAGNOSIS — J9801 Acute bronchospasm: Secondary | ICD-10-CM | POA: Insufficient documentation

## 2013-02-11 DIAGNOSIS — R112 Nausea with vomiting, unspecified: Secondary | ICD-10-CM | POA: Insufficient documentation

## 2013-02-11 DIAGNOSIS — Z79899 Other long term (current) drug therapy: Secondary | ICD-10-CM | POA: Insufficient documentation

## 2013-02-11 DIAGNOSIS — M069 Rheumatoid arthritis, unspecified: Secondary | ICD-10-CM | POA: Insufficient documentation

## 2013-02-11 DIAGNOSIS — J209 Acute bronchitis, unspecified: Secondary | ICD-10-CM | POA: Insufficient documentation

## 2013-02-11 DIAGNOSIS — Z8669 Personal history of other diseases of the nervous system and sense organs: Secondary | ICD-10-CM | POA: Insufficient documentation

## 2013-02-11 DIAGNOSIS — R000 Tachycardia, unspecified: Secondary | ICD-10-CM | POA: Insufficient documentation

## 2013-02-11 DIAGNOSIS — Z3202 Encounter for pregnancy test, result negative: Secondary | ICD-10-CM | POA: Insufficient documentation

## 2013-02-11 DIAGNOSIS — A419 Sepsis, unspecified organism: Secondary | ICD-10-CM | POA: Insufficient documentation

## 2013-02-11 DIAGNOSIS — Z87891 Personal history of nicotine dependence: Secondary | ICD-10-CM | POA: Insufficient documentation

## 2013-02-11 DIAGNOSIS — E876 Hypokalemia: Secondary | ICD-10-CM | POA: Insufficient documentation

## 2013-02-11 DIAGNOSIS — R519 Headache, unspecified: Secondary | ICD-10-CM

## 2013-02-11 DIAGNOSIS — R6510 Systemic inflammatory response syndrome (SIRS) of non-infectious origin without acute organ dysfunction: Secondary | ICD-10-CM

## 2013-02-11 DIAGNOSIS — D649 Anemia, unspecified: Secondary | ICD-10-CM | POA: Insufficient documentation

## 2013-02-11 DIAGNOSIS — Z716 Tobacco abuse counseling: Secondary | ICD-10-CM

## 2013-02-11 DIAGNOSIS — R197 Diarrhea, unspecified: Secondary | ICD-10-CM | POA: Insufficient documentation

## 2013-02-11 NOTE — ED Notes (Signed)
Pt. reports dry cough for 3 weeks , nausea and vomitting onset Friday and diarrhea last Saturday. Denies fever or chills.

## 2013-02-12 ENCOUNTER — Emergency Department (HOSPITAL_COMMUNITY): Payer: Self-pay

## 2013-02-12 LAB — CBC
Hemoglobin: 12.6 g/dL (ref 12.0–15.0)
MCHC: 37 g/dL — ABNORMAL HIGH (ref 30.0–36.0)
Platelets: 290 10*3/uL (ref 150–400)
RDW: 13.2 % (ref 11.5–15.5)

## 2013-02-12 LAB — BASIC METABOLIC PANEL
BUN: 9 mg/dL (ref 6–23)
Calcium: 9.4 mg/dL (ref 8.4–10.5)
Creatinine, Ser: 0.7 mg/dL (ref 0.50–1.10)
GFR calc Af Amer: >90 mL/min (ref >90)
GFR calc non Af Amer: >90 mL/min (ref >90)
Potassium: 3.1 mEq/L — ABNORMAL LOW (ref 3.5–5.1)

## 2013-02-12 MED ORDER — SODIUM CHLORIDE 0.9 % IV BOLUS (SEPSIS)
2000.0000 mL | Freq: Once | INTRAVENOUS | Status: AC
  Administered 2013-02-12: 2000 mL via INTRAVENOUS

## 2013-02-12 MED ORDER — ONDANSETRON 4 MG PO TBDP
ORAL_TABLET | ORAL | Status: AC
  Filled 2013-02-12: qty 1

## 2013-02-12 MED ORDER — ALBUTEROL SULFATE HFA 108 (90 BASE) MCG/ACT IN AERS: 1.0000 | INHALATION_SPRAY | Freq: Four times a day (QID) | RESPIRATORY_TRACT | Status: DC | PRN

## 2013-02-12 MED ORDER — POTASSIUM CHLORIDE CRYS ER 20 MEQ PO TBCR
40.0000 meq | EXTENDED_RELEASE_TABLET | Freq: Once | ORAL | Status: AC
  Administered 2013-02-12: 40 meq via ORAL
  Filled 2013-02-12: qty 2

## 2013-02-12 MED ORDER — ONDANSETRON 4 MG PO TBDP: 8.0000 mg | ORAL_TABLET | Freq: Once | ORAL | Status: DC

## 2013-02-12 MED ORDER — ALBUTEROL SULFATE (5 MG/ML) 0.5% IN NEBU
INHALATION_SOLUTION | RESPIRATORY_TRACT | Status: AC
  Filled 2013-02-12: qty 1

## 2013-02-12 MED ORDER — PREDNISONE 20 MG PO TABS: ORAL_TABLET | ORAL | Status: DC

## 2013-02-12 MED ORDER — ALBUTEROL SULFATE (5 MG/ML) 0.5% IN NEBU
5.0000 mg | INHALATION_SOLUTION | Freq: Once | RESPIRATORY_TRACT | Status: AC
  Administered 2013-02-12: 5 mg via RESPIRATORY_TRACT
  Filled 2013-02-12: qty 1

## 2013-02-12 MED ORDER — AZITHROMYCIN 250 MG PO TABS: ORAL_TABLET | ORAL | Status: DC

## 2013-02-12 MED ORDER — IBUPROFEN 400 MG PO TABS: 400.0000 mg | ORAL_TABLET | Freq: Once | ORAL | Status: DC

## 2013-02-12 MED ORDER — ONDANSETRON HCL 4 MG/2ML IJ SOLN
4.0000 mg | Freq: Once | INTRAMUSCULAR | Status: AC
  Administered 2013-02-12: 4 mg via INTRAVENOUS
  Filled 2013-02-12: qty 2

## 2013-02-12 MED ORDER — HYDROCOD POLST-CHLORPHEN POLST 10-8 MG/5ML PO LQCR: 5.0000 mL | Freq: Two times a day (BID) | ORAL | Status: DC | PRN

## 2013-02-12 NOTE — ED Notes (Signed)
Pt reports 3 week cough, dry, non-productive. Changed to productive on Friday, scant clear sputumn. Also developed nv on Friday and loose stool on Saturday. "a little light headed and a little light headed".  Last BM Saturday (loose). Last emesis 2200. Last ate 1600. Last drank 2100. Tongue yellow. Throat unremarkable, LS CTA. "had a sore throat, now resolved". (denies: pain, fever or dizziness). Has tried mucinex, delsym, cold and congestion meds OTC). Not seen for same previously.

## 2013-02-12 NOTE — ED Provider Notes (Signed)
CSN: [id number]     Arrival date & time 02/11/13  2314 History   First MD Initiated Contact with Patient 02/12/13 0154     Chief Complaint  Patient presents with  . Cough  . Emesis  . Diarrhea   (Consider location/radiation/quality/duration/timing/severity/associated sxs/prior Treatment) HPI  Patient is a 27 yo woman with rheumatoid arthritis and a history of migraine headaches and anemia. She presents with 1 week of a minimally productive cough with clear sputum.   Over the past few days she has also developed nausea and vomiting. She estimates that she vomited 3 times on Friday and 3-4 times on Saturday and twice yesterday (Sunday). She has had some dry heaves as well. She says she is uncertain if her episodes of emesis are post tussive or spontaneous. Vomitus has been NBNB. She denies any history of abdominal surgeries. She denies diarrhea. Notes that she had a single unusually soft BM 2d ago.   Patient notes that she has a diffuse headache which has been ongoing for about 24 hrs, is moderate in severity and feels like multiple previous migraine headaches.   She denies any recent international travel. No known ill contacts. NO SOB or CP. Patient is a 1/2 ppd smoker.   Past Medical History  Diagnosis Date  . Hx of migraines   . RA (rheumatoid arthritis) 2013  . Anemia    Past Surgical History  Procedure Laterality Date  . Ovarian cyst surgery    . Wisdom tooth extraction     Family History  Problem Relation Age of Onset  . Hypertension Maternal Grandmother   . Anemia Mother   . Thyroid disease Maternal Aunt    History  Substance Use Topics  . Smoking status: Former Smoker -- 0.50 packs/day    Types: Cigarettes    Quit date: 10/25/2011  . Smokeless tobacco: Never Used     Comment: Trying to quit smoking.  Quit on 10/25/2011  . Alcohol Use: Yes     Comment: occ   OB History   Grav Para Term Preterm Abortions TAB SAB Ect Mult Living   1 1        1      Review of  Systems 10 point ROS performed and is negative with the exception of sx noted above and chronic joint pain secondary to RA.   Allergies  Review of patient's allergies indicates no known allergies.  Home Medications   Current Outpatient Rx  Name  Route  Sig  Dispense  Refill  . dextromethorphan-guaiFENesin (MUCINEX DM) 30-600 MG per 12 hr tablet   Oral   Take 1 tablet by mouth every 12 (twelve) hours.         Marland Kitchen HYDROcodone-acetaminophen (NORCO/VICODIN) 5-325 MG per tablet   Oral   Take 1 tablet by mouth every 6 (six) hours as needed for pain.   30 tablet   0   . medroxyPROGESTERone (DEPO-PROVERA) 150 MG/ML injection   Intramuscular   Inject 150 mg into the muscle every 3 (three) months.         . Multiple Vitamin (MULTIVITAMIN) tablet   Oral   Take 1 tablet by mouth daily.          BP 123/80  Pulse 69  Temp(Src) 97.5 F (36.4 C) (Oral)  Resp 16  SpO2 100% Physical Exam Gen: well developed and well nourished appearing, frequent bronchospastic sounding cough Head: NCAT Eyes: PERL, EOMI Nose: no epistaixis or rhinorrhea Mouth/throat: mucosa is moist and pink Neck:  supple, no stridor Lungs: CTA B, no wheezing, rhonchi or rales, RR 24/min CV: Rapid rate, pulse 104-110, no murmur, extremities well perfused Abd: soft, notender, nondistended Back: no ttp, no cva ttp Skin: Warm and dry, no rash Neuro: CN ii-xii grossly intact, no focal deficits Psyche; normal affect,  calm and cooperative.   ED Course  Procedures (including critical care time)  Results for orders placed during the hospital encounter of 02/11/13 (from the past 24 hour(s))  BASIC METABOLIC PANEL     Status: Abnormal   Collection Time    02/12/13  2:58 AM      Result Value Range   Sodium 138  135 - 145 mEq/L   Potassium 3.1 (*) 3.5 - 5.1 mEq/L   Chloride 103  96 - 112 mEq/L   CO2 21  19 - 32 mEq/L   Glucose, Bld 98  70 - 99 mg/dL   BUN 9  6 - 23 mg/dL   Creatinine, Ser 1.61  0.50 - 1.10 mg/dL    Calcium 9.4  8.4 - 09.6 mg/dL   GFR calc non Af Amer >90  >90 mL/min   GFR calc Af Amer >90  >90 mL/min  CBC     Status: Abnormal   Collection Time    02/12/13  2:58 AM      Result Value Range   WBC 11.5 (*) 4.0 - 10.5 K/uL   RBC 4.17  3.87 - 5.11 MIL/uL   Hemoglobin 12.6  12.0 - 15.0 g/dL   HCT 04.5 (*) 40.9 - 81.1 %   MCV 81.8  78.0 - 100.0 fL   MCH 30.2  26.0 - 34.0 pg   MCHC 37.0 (*) 30.0 - 36.0 g/dL   RDW 91.4  78.2 - 95.6 %   Platelets 290  150 - 400 K/uL   CHEST 2 VIEW  COMPARISON: For 05/2011  FINDINGS: Normal heart size and pulmonary vascularity. No focal airspace disease or consolidation in the lungs. No blunting of costophrenic angles. No pneumothorax. Mediastinal contours appear intact. Small cervical ribs. No significant changes since previous study.  IMPRESSION: No active cardiopulmonary disease.   Electronically Signed By: Burman Nieves M.D. On: 02/12/2013 03:35   EKG Interpretation   None       MDM  Patient with sx suggestive of bronchitis - particularly in light of smoking hx. Now with nausea, vomiting and difficulty tolerating po intake. The patient is afebrile but, in light of her tachycardia at rest, we will perform a 2 view CXR to ruled out the pneumonia. We will tx with IVF for suspected volume depletion/dehydration and give Zofran for nausea then reassess. The patient is taking Depo Provera and LMP was 3/14. Will perform POC urine preg prior to CXR.     Brandt Loosen, MD 02/12/13 [phone]

## 2013-02-12 NOTE — ED Notes (Signed)
"  feels about the same", & "feels better", reports vomiting up the Kdur. Alert, NAD< calm, interactive, denies needs sx questions or concerns unmet.

## 2013-02-12 NOTE — ED Notes (Signed)
No changes, to xray, alert, NAD, calm, interactive, frequent persistant cough.

## 2013-02-12 NOTE — ED Notes (Signed)
Neb in progress by RT, "pt cannot swallow tablets w/o crackers", ibuprofen and zofran delayed d/t neb and inability of pt to swallow w/o crackers.

## 2013-02-12 NOTE — ED Notes (Signed)
Neb complete Dr. Lavella Lemons at [facility]. Pt resting, calm, NAD, alert.

## 2013-07-05 ENCOUNTER — Encounter (HOSPITAL_BASED_OUTPATIENT_CLINIC_OR_DEPARTMENT_OTHER): Payer: Self-pay | Admitting: Emergency Medicine

## 2013-07-05 ENCOUNTER — Emergency Department (HOSPITAL_BASED_OUTPATIENT_CLINIC_OR_DEPARTMENT_OTHER)
Admission: EM | Admit: 2013-07-05 | Discharge: 2013-07-05 | Disposition: A | Discharge status: Home / Self Care | Payer: Self-pay | Attending: Emergency Medicine | Admitting: Emergency Medicine

## 2013-07-05 ENCOUNTER — Emergency Department (HOSPITAL_BASED_OUTPATIENT_CLINIC_OR_DEPARTMENT_OTHER): Payer: Self-pay

## 2013-07-05 DIAGNOSIS — Z87891 Personal history of nicotine dependence: Secondary | ICD-10-CM | POA: Insufficient documentation

## 2013-07-05 DIAGNOSIS — Z8679 Personal history of other diseases of the circulatory system: Secondary | ICD-10-CM | POA: Insufficient documentation

## 2013-07-05 DIAGNOSIS — M069 Rheumatoid arthritis, unspecified: Secondary | ICD-10-CM | POA: Insufficient documentation

## 2013-07-05 DIAGNOSIS — R200 Anesthesia of skin: Secondary | ICD-10-CM

## 2013-07-05 DIAGNOSIS — Z862 Personal history of diseases of the blood and blood-forming organs and certain disorders involving the immune mechanism: Secondary | ICD-10-CM | POA: Insufficient documentation

## 2013-07-05 DIAGNOSIS — R202 Paresthesia of skin: Secondary | ICD-10-CM

## 2013-07-05 DIAGNOSIS — Z79899 Other long term (current) drug therapy: Secondary | ICD-10-CM | POA: Insufficient documentation

## 2013-07-05 DIAGNOSIS — R209 Unspecified disturbances of skin sensation: Secondary | ICD-10-CM | POA: Insufficient documentation

## 2013-07-05 DIAGNOSIS — M25519 Pain in unspecified shoulder: Secondary | ICD-10-CM | POA: Insufficient documentation

## 2013-07-05 LAB — BASIC METABOLIC PANEL
BUN: 9 mg/dL (ref 6–23)
CALCIUM: 9.4 mg/dL (ref 8.4–10.5)
CO2: 25 mEq/L (ref 19–32)
CREATININE: 0.7 mg/dL (ref 0.50–1.10)
Chloride: 105 mEq/L (ref 96–112)
GFR calc Af Amer: >90 mL/min (ref >90)
GLUCOSE: 92 mg/dL (ref 70–99)
POTASSIUM: 3.8 meq/L (ref 3.7–5.3)
Sodium: 141 mEq/L (ref 137–147)

## 2013-07-05 LAB — CBC
HEMATOCRIT: 33 % — AB (ref 36.0–46.0)
HEMOGLOBIN: 11.9 g/dL — AB (ref 12.0–15.0)
MCH: 30.3 pg (ref 26.0–34.0)
MCHC: 36.1 g/dL — AB (ref 30.0–36.0)
MCV: 84 fL (ref 78.0–100.0)
Platelets: 260 10*3/uL (ref 150–400)
RBC: 3.93 MIL/uL (ref 3.87–5.11)
RDW: 12.6 % (ref 11.5–15.5)
WBC: 7.7 10*3/uL (ref 4.0–10.5)

## 2013-07-05 MED ORDER — OXYCODONE-ACETAMINOPHEN 5-325 MG PO TABS
1.0000 | ORAL_TABLET | Freq: Once | ORAL | Status: AC
  Administered 2013-07-05: 1 via ORAL
  Filled 2013-07-05: qty 1

## 2013-07-05 MED ORDER — OXYCODONE-ACETAMINOPHEN 5-325 MG PO TABS: 1.0000 | ORAL_TABLET | Freq: Four times a day (QID) | ORAL | Status: DC | PRN

## 2013-07-05 NOTE — Discharge Instructions (Signed)
Return to the ED with any concerns including weakness of arms or legs, fever/chills, chest pain, difficulty breathing, decreased level of alertness/lethargy, or any other alarming symptoms

## 2013-07-05 NOTE — ED Provider Notes (Signed)
CSN: [id number]     Arrival date & time 07/05/13  1802 History  This chart was scribed for Heather Chick, MD by Ardelia Mems, ED Scribe. This patient was seen in room MH12/MH12 and the patient's care was started at 8:28 PM.  Chief Complaint  Patient presents with  . Numbness    Patient is a 28 y.o. female presenting with arm injury. The history is provided by the patient. No language interpreter was used.  Arm Injury Location:  Arm Time since incident:  6 days Injury: no   Arm location:  L arm (numbness from left shoulder that extends down to left hand) Pain details:    Quality:  Aching   Severity:  Mild   Onset quality:  Gradual   Duration:  6 days   Timing:  Constant   Progression:  Waxing and waning Chronicity:  Recurrent (history of similar symptoms 1 week ago, which resolved, but have now returned) Dislocation: no   Prior injury to area:  No Relieved by:  Nothing Worsened by:  Nothing tried Ineffective treatments:  NSAIDs (Ibuprofen) Associated symptoms: numbness and tingling   Associated symptoms: no back pain and no neck pain     HPI Comments: Heather Washington is a 28 y.o. female with a history of RA awho presents to the Emergency Department complaining of left arm numbness (from the shoulder down to the hand) with associated paresthesias in the fingertips of her left hand over the past 6 days. She states that her arm feels asleep. She reports that she is also having some pain in her left shoulder, but she denies any neck pain. She states that she has been taking Ibuprofen without relief. She denies any injury to have onset her symptoms. She states that she had similar numbness in her left arm last week, which had resolved. She states that she has a history of RA, which typically affects her fingers and wrists. She states that her current symptoms do not feel like prior arthritis flare-ups.    Past Medical History  Diagnosis Date  . Hx of migraines   . RA (rheumatoid  arthritis) 2013  . Anemia    Past Surgical History  Procedure Laterality Date  . Ovarian cyst surgery    . Wisdom tooth extraction     Family History  Problem Relation Age of Onset  . Hypertension Maternal Grandmother   . Anemia Mother   . Thyroid disease Maternal Aunt    History  Substance Use Topics  . Smoking status: Former Smoker -- 0.50 packs/day    Types: Cigarettes    Quit date: 10/25/2011  . Smokeless tobacco: Never Used     Comment: Trying to quit smoking.  Quit on 10/25/2011  . Alcohol Use: Yes     Comment: occ   OB History   Grav Para Term Preterm Abortions TAB SAB Ect Mult Living   1 1        1      Review of Systems  Musculoskeletal: Positive for arthralgias (left shoulder). Negative for back pain and neck pain.  Neurological: Positive for numbness (left arm).  All other systems reviewed and are negative.   Allergies  Review of patient's allergies indicates no known allergies.  Home Medications   Current Outpatient Rx  Name  Route  Sig  Dispense  Refill  . albuterol (PROVENTIL HFA;VENTOLIN HFA) 108 (90 BASE) MCG/ACT inhaler   Inhalation   Inhale 1-2 puffs into the lungs every 6 (six) hours  as needed for wheezing.   1 Inhaler   0   . albuterol (PROVENTIL HFA;VENTOLIN HFA) 108 (90 BASE) MCG/ACT inhaler   Inhalation   Inhale 1-2 puffs into the lungs every 6 (six) hours as needed for wheezing.   1 Inhaler   0   . azithromycin (ZITHROMAX Z-PAK) 250 MG tablet      Take as directed   6 each   0   . chlorpheniramine-HYDROcodone (TUSSIONEX PENNKINETIC ER) 10-8 MG/5ML LQCR   Oral   Take 5 mLs by mouth every 12 (twelve) hours as needed.   100 mL   0   . dextromethorphan-guaiFENesin (MUCINEX DM) 30-600 MG per 12 hr tablet   Oral   Take 1 tablet by mouth every 12 (twelve) hours.         Marland Kitchen. HYDROcodone-acetaminophen (NORCO/VICODIN) 5-325 MG per tablet   Oral   Take 1 tablet by mouth every 6 (six) hours as needed for pain.   30 tablet   0   .  medroxyPROGESTERone (DEPO-PROVERA) 150 MG/ML injection   Intramuscular   Inject 150 mg into the muscle every 3 (three) months.         . Multiple Vitamin (MULTIVITAMIN) tablet   Oral   Take 1 tablet by mouth daily.         Marland Kitchen. oxyCODONE-acetaminophen (PERCOCET/ROXICET) 5-325 MG per tablet   Oral   Take 1-2 tablets by mouth every 6 (six) hours as needed for severe pain.   15 tablet   0   . predniSONE (DELTASONE) 20 MG tablet      3 tabs po day one, then 2 tabs daily x 4 days   11 tablet   0   . predniSONE (DELTASONE) 20 MG tablet      3 tabs po day one, then 2 tabs daily x 4 days   11 tablet   0    Triage Vitals: BP 108/74  Pulse 80  Temp(Src) 99.1 F (37.3 C) (Oral)  Resp 16  SpO2 100%  Physical Exam  Nursing note and vitals reviewed. Constitutional: She is oriented to person, place, and time. She appears well-developed and well-nourished. No distress.  HENT:  Head: Normocephalic and atraumatic.  Eyes: EOM are normal.  Neck: Neck supple. No tracheal deviation present.  Cardiovascular: Normal rate.   Pulmonary/Chest: Effort normal. No respiratory distress.  Musculoskeletal: Normal range of motion.  Neurological: She is alert and oriented to person, place, and time.  Skin: Skin is warm and dry.  Psychiatric: She has a normal mood and affect. Her behavior is normal.  note- neuro- strength 5/5 in extremities x 4, sensation intact, cranial nerves 2-12 tested and intact Neck- no midline tenderness to palpation Lungs- CTAB, CV- RRR no murmurs ED Course  Procedures (including critical care time)  DIAGNOSTIC STUDIES: Oxygen Saturation is 100% on RA, normal by my interpretation.    COORDINATION OF CARE: 8:33 PM- Discussed plan to obtain diagnostic lab work and radiology. Will also order Percocet. Pt advised of plan for treatment and pt agrees.  Medications  oxyCODONE-acetaminophen (PERCOCET/ROXICET) 5-325 MG per tablet 1 tablet (1 tablet Oral Given 07/05/13 2105)    Labs Review Labs Reviewed  CBC - Abnormal; Notable for the following:    Hemoglobin 11.9 (*)    HCT 33.0 (*)    MCHC 36.1 (*)    All other components within normal limits  BASIC METABOLIC PANEL   Imaging Review Ct Head Wo Contrast  07/05/2013   CLINICAL DATA:  Left arm paresthesias, migraines, rheumatoid arthritis, anemia  EXAM: CT HEAD WITHOUT CONTRAST  TECHNIQUE: Contiguous axial images were obtained from the base of the skull through the vertex without contrast.  COMPARISON:  None  FINDINGS: Normal appearance of the intracranial structures. No evidence for acute hemorrhage, mass lesion, midline shift, hydrocephalus or large infarct. No acute bony abnormality. The visualized sinuses are clear.  IMPRESSION: No acute intracranial abnormality.   Electronically Signed   By: Ruel Favors M.D.   On: 07/05/2013 20:57     EKG Interpretation   Date/Time:  Thursday July 05 2013 21:03:39 EDT Ventricular Rate:  75 PR Interval:  198 QRS Duration: 84 QT Interval:  374 QTC Calculation: 417 R Axis:   71 Text Interpretation:  Normal sinus rhythm Normal ECG No significant change  since last tracing Confirmed by [facility]  MD, Seleta Hovland [phone]) on 07/05/2013  10:06:22 PM      MDM   Final diagnoses:  Numbness and tingling  Shoulder pain    Pt presenting with pain in her left shoulder as well as sensation of numbness and tingling in left arm/hand.  Neuro exam normal.  No neck pain.  Still may be some component of radicular pain.  No acute swelling or signs of RA flare.  Pt advised f/u with neurology.  Discharged with strict return precautions.  Pt agreeable with plan.  I personally performed the services described in this documentation, which was scribed in my presence. The recorded information has been reviewed and is accurate.   Heather Chick, MD 07/10/13 [phone]

## 2013-07-05 NOTE — ED Notes (Signed)
Hx of RA. Numbness in her left arm x 6 days.

## 2014-02-25 ENCOUNTER — Encounter (HOSPITAL_BASED_OUTPATIENT_CLINIC_OR_DEPARTMENT_OTHER): Payer: Self-pay | Admitting: Emergency Medicine

## 2014-05-24 ENCOUNTER — Emergency Department (HOSPITAL_COMMUNITY)
Admission: EM | Admit: 2014-05-24 | Discharge: 2014-05-24 | Disposition: A | Discharge status: Home / Self Care | Payer: No Typology Code available for payment source | Attending: Emergency Medicine | Admitting: Emergency Medicine

## 2014-05-24 ENCOUNTER — Encounter (HOSPITAL_COMMUNITY): Payer: Self-pay | Admitting: Emergency Medicine

## 2014-05-24 DIAGNOSIS — Y92410 Unspecified street and highway as the place of occurrence of the external cause: Secondary | ICD-10-CM | POA: Insufficient documentation

## 2014-05-24 DIAGNOSIS — M069 Rheumatoid arthritis, unspecified: Secondary | ICD-10-CM | POA: Insufficient documentation

## 2014-05-24 DIAGNOSIS — Y9389 Activity, other specified: Secondary | ICD-10-CM | POA: Diagnosis not present

## 2014-05-24 DIAGNOSIS — S4991XA Unspecified injury of right shoulder and upper arm, initial encounter: Secondary | ICD-10-CM | POA: Diagnosis not present

## 2014-05-24 DIAGNOSIS — Z79899 Other long term (current) drug therapy: Secondary | ICD-10-CM | POA: Insufficient documentation

## 2014-05-24 DIAGNOSIS — Z8679 Personal history of other diseases of the circulatory system: Secondary | ICD-10-CM | POA: Diagnosis not present

## 2014-05-24 DIAGNOSIS — Z87891 Personal history of nicotine dependence: Secondary | ICD-10-CM | POA: Diagnosis not present

## 2014-05-24 DIAGNOSIS — Z862 Personal history of diseases of the blood and blood-forming organs and certain disorders involving the immune mechanism: Secondary | ICD-10-CM | POA: Insufficient documentation

## 2014-05-24 DIAGNOSIS — Y998 Other external cause status: Secondary | ICD-10-CM | POA: Diagnosis not present

## 2014-05-24 DIAGNOSIS — Z7952 Long term (current) use of systemic steroids: Secondary | ICD-10-CM | POA: Insufficient documentation

## 2014-05-24 DIAGNOSIS — M25511 Pain in right shoulder: Secondary | ICD-10-CM

## 2014-05-24 DIAGNOSIS — S39012A Strain of muscle, fascia and tendon of lower back, initial encounter: Secondary | ICD-10-CM | POA: Insufficient documentation

## 2014-05-24 MED ORDER — CYCLOBENZAPRINE HCL 10 MG PO TABS: 10.0000 mg | ORAL_TABLET | Freq: Two times a day (BID) | ORAL | Status: DC | PRN

## 2014-05-24 MED ORDER — HYDROCODONE-ACETAMINOPHEN 5-325 MG PO TABS: 2.0000 | ORAL_TABLET | ORAL | Status: DC | PRN
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
## 2015-04-24 IMAGING — CT CT HEAD W/O CM
1 series · 16 of 30 positions shown, 20 images · non-contrast
Comparison: None

CLINICAL DATA: Left arm paresthesias, migraines, rheumatoid
arthritis, anemia

EXAM:
CT HEAD WITHOUT CONTRAST
TECHNIQUE: Contiguous axial images were obtained from the base of the skull
through the vertex without contrast.

[Series 2: head 4.8 h37s · axial · 0.45mm/px · z∈[-213,-76]mm · 16 of 32 slices shown, 20 images]
[im 2/32  brain]
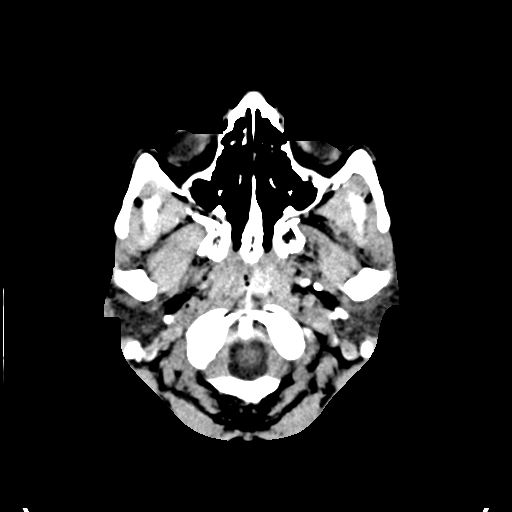
[im 2/32  bone]
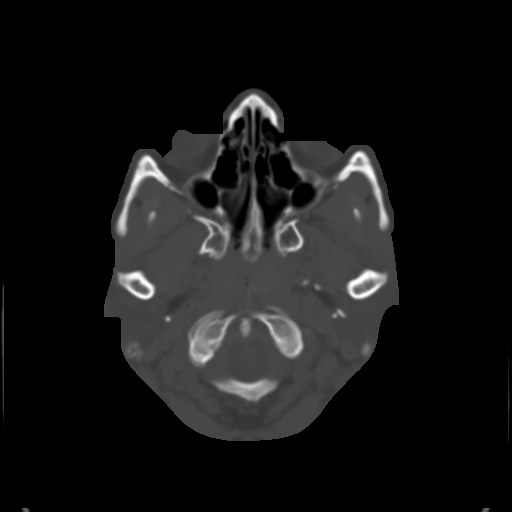
[im 4/32  brain]
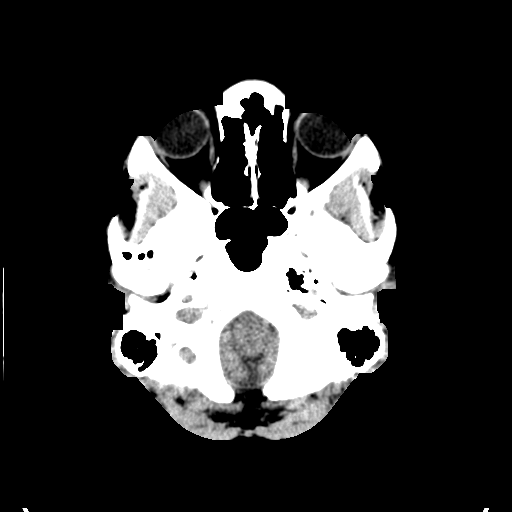
[im 6/32  brain]
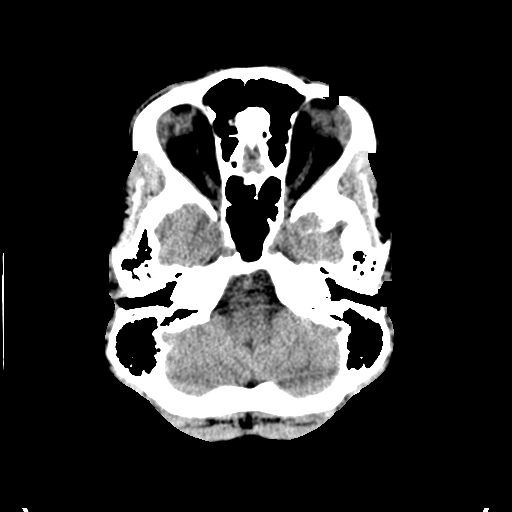
[im 8/32  brain]
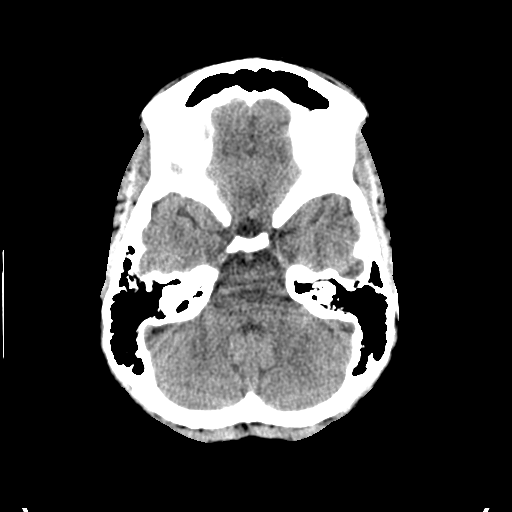
[im 9/32  brain]
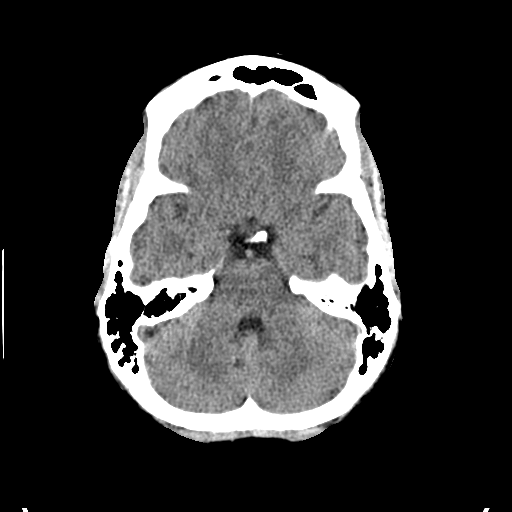
[im 9/32  bone]
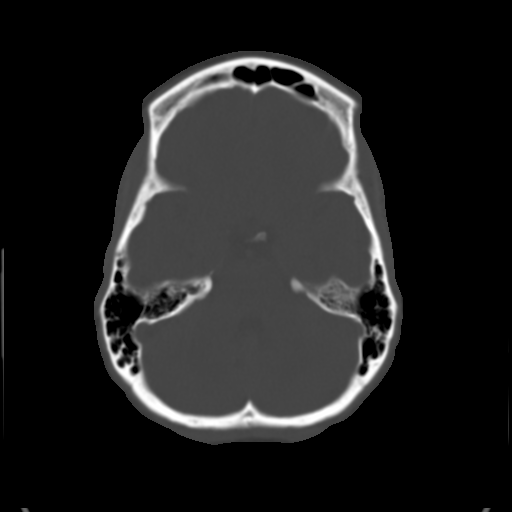
[im 11/32  brain]
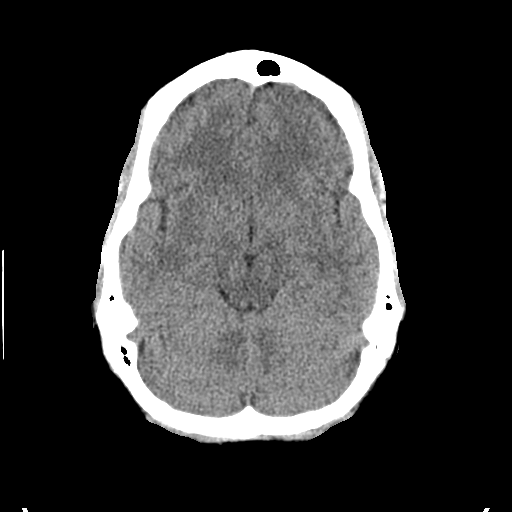
[im 13/32  brain]
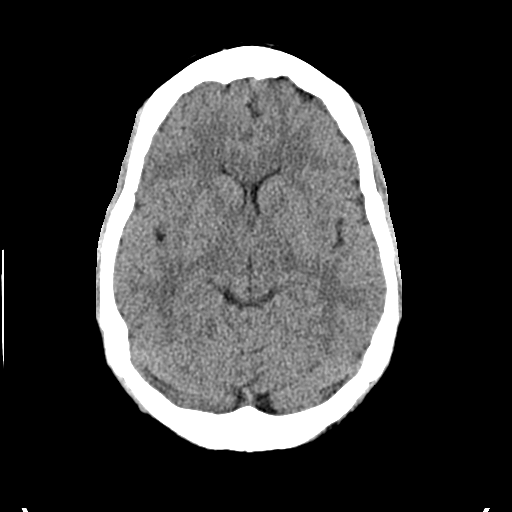
[im 15/32  brain]
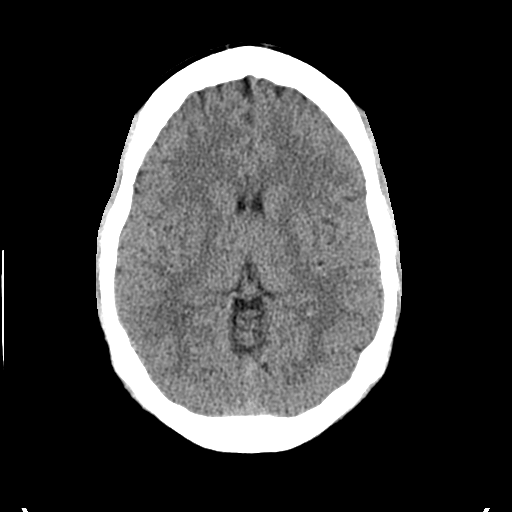
[im 17/32  brain]
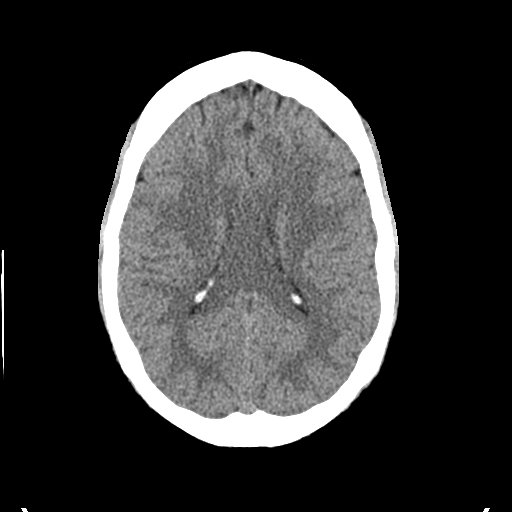
[im 17/32  bone]
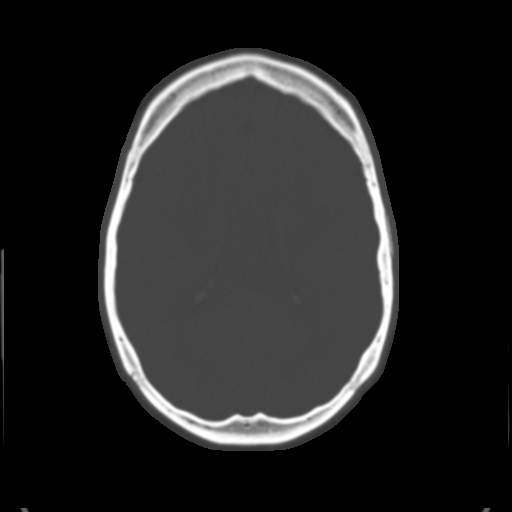
[im 19/32  brain]
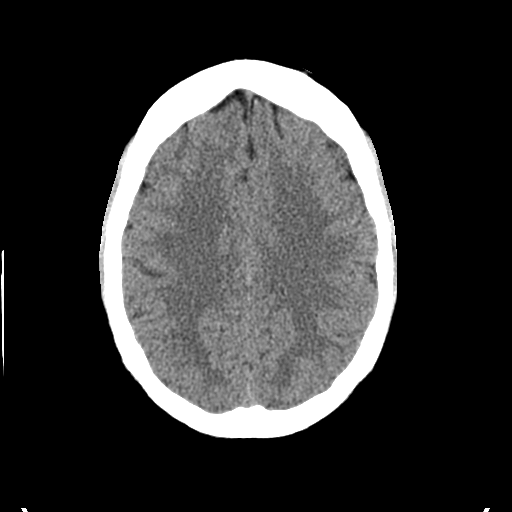
[im 21/32  brain]
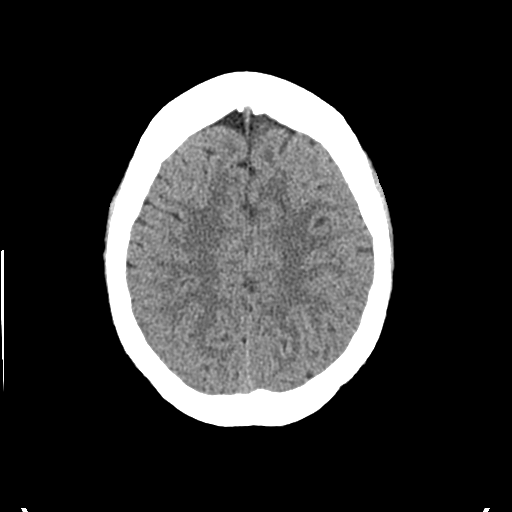
[im 23/32  brain]
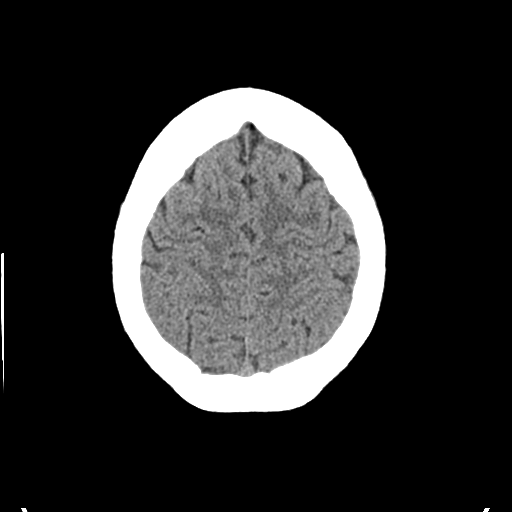
[im 24/32  brain]
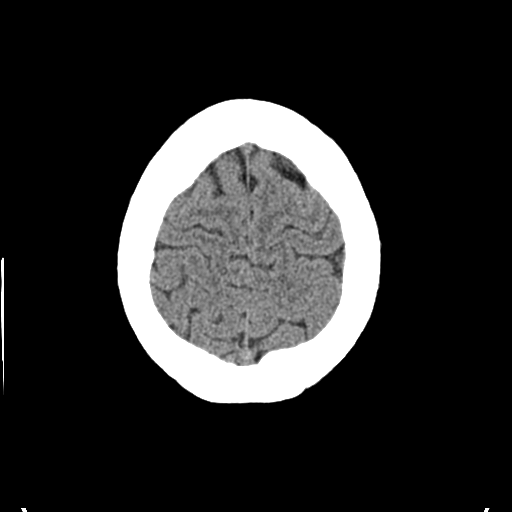
[im 24/32  bone]
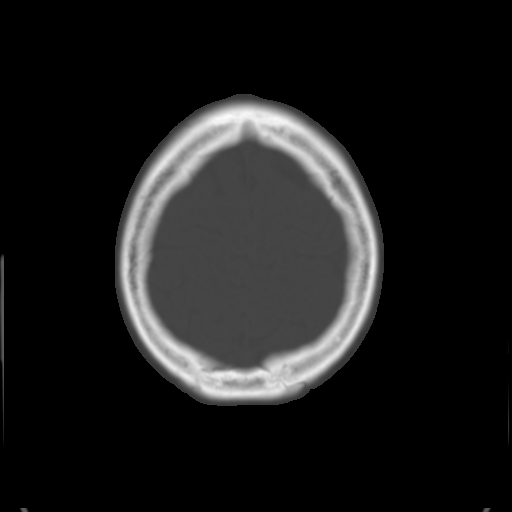
[im 26/32  brain]
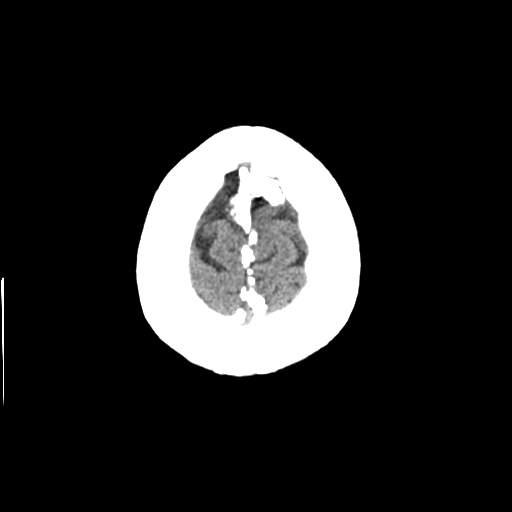
[im 28/32  brain]
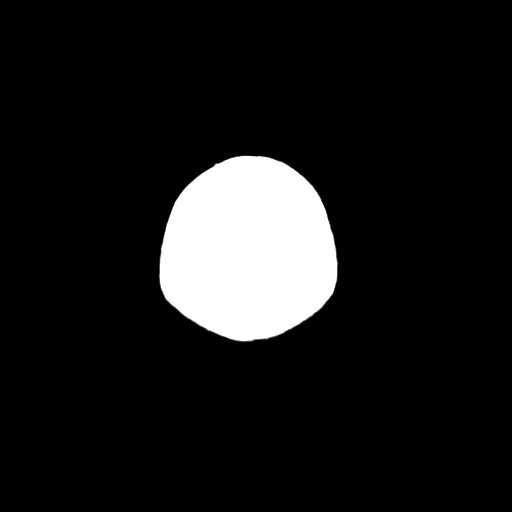
[im 30/32  brain]
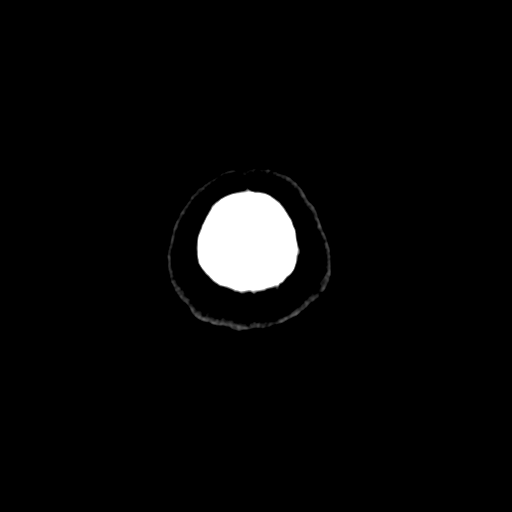

[16 of 30 positions shown; findings below may reference images not displayed]

FINDINGS: Normal appearance of the intracranial structures. No evidence for
acute hemorrhage, mass lesion, midline shift, hydrocephalus or large
infarct. No acute bony abnormality. The visualized sinuses are
clear.
IMPRESSION: No acute intracranial abnormality.

## 2016-03-12 ENCOUNTER — Encounter (HOSPITAL_BASED_OUTPATIENT_CLINIC_OR_DEPARTMENT_OTHER): Payer: Self-pay | Admitting: *Deleted

## 2016-03-12 ENCOUNTER — Emergency Department (HOSPITAL_BASED_OUTPATIENT_CLINIC_OR_DEPARTMENT_OTHER): Payer: Medicaid Other

## 2016-03-12 ENCOUNTER — Emergency Department (HOSPITAL_BASED_OUTPATIENT_CLINIC_OR_DEPARTMENT_OTHER)
Admission: EM | Admit: 2016-03-12 | Discharge: 2016-03-12 | Disposition: A | Discharge status: Home / Self Care | Payer: Medicaid Other | Attending: Emergency Medicine | Admitting: Emergency Medicine

## 2016-03-12 DIAGNOSIS — F17210 Nicotine dependence, cigarettes, uncomplicated: Secondary | ICD-10-CM | POA: Insufficient documentation

## 2016-03-12 DIAGNOSIS — Z79899 Other long term (current) drug therapy: Secondary | ICD-10-CM | POA: Diagnosis not present

## 2016-03-12 DIAGNOSIS — M79672 Pain in left foot: Secondary | ICD-10-CM | POA: Diagnosis present

## 2016-03-12 DIAGNOSIS — M25572 Pain in left ankle and joints of left foot: Secondary | ICD-10-CM | POA: Diagnosis not present

## 2016-03-12 NOTE — ED Triage Notes (Signed)
Pain in her left foot. No known injury.

## 2016-03-12 NOTE — Discharge Instructions (Signed)
Your ankle pain should improve with rest, an ACE bandage, Aleve or Ibuprofen, and elevation. If no improvement over the next several days, please follow up with your PCP.

## 2016-03-12 NOTE — ED Notes (Signed)
Patient transported to X-ray 

## 2016-03-12 NOTE — ED Provider Notes (Signed)
[facility]   CSN: [id number] Arrival date & time: 03/12/16  1302  History   Chief Complaint Chief Complaint  Patient presents with  . Foot Pain    HPI Heather Washington is a 30 y.o. female.  HPI  Presenting with left anterior ankle pain for two days. First noted pain two days ago, but seemed to resolve yesterday. Noted pain had returned this morning when she woke up and sh was unable to bear weight. Denies trauma. Also notes some swelling in anterior foot. Denies decreased sensation or ROM.  Past Medical History:  Diagnosis Date  . Anemia   . Hx of migraines   . RA (rheumatoid arthritis) (HCC) 2013    Patient Active Problem List   Diagnosis Date Noted  . Endometriosis 05/12/2012  . Hx of migraines   . RA (rheumatoid arthritis) (HCC)     Past Surgical History:  Procedure Laterality Date  . OVARIAN CYST SURGERY    . WISDOM TOOTH EXTRACTION      OB History    Gravida Para Term Preterm AB Living   1 1       1    SAB TAB Ectopic Multiple Live Births                   Home Medications    Prior to Admission medications   Medication Sig Start Date End Date Taking? Authorizing Provider  medroxyPROGESTERone (DEPO-PROVERA) 150 MG/ML injection Inject 150 mg into the muscle every 3 (three) months.   Yes Historical Provider, MD  Meloxicam (MOBIC PO) Take by mouth.   Yes Historical Provider, MD  albuterol (PROVENTIL HFA;VENTOLIN HFA) 108 (90 BASE) MCG/ACT inhaler Inhale 1-2 puffs into the lungs every 6 (six) hours as needed for wheezing. 02/12/13   02/14/13, MD  albuterol (PROVENTIL HFA;VENTOLIN HFA) 108 (90 BASE) MCG/ACT inhaler Inhale 1-2 puffs into the lungs every 6 (six) hours as needed for wheezing. 02/12/13   02/14/13, MD  azithromycin (ZITHROMAX Z-PAK) 250 MG tablet Take as directed 02/12/13   02/14/13, MD  chlorpheniramine-HYDROcodone [facility]) 10-8 MG/5ML [facility] Take 5 mLs by mouth every 12 (twelve) hours as needed.  02/12/13   02/14/13, MD  cyclobenzaprine (FLEXERIL) 10 MG tablet Take 1 tablet (10 mg total) by mouth 2 (two) times daily as needed for muscle spasms. 05/24/14   05/26/14, PA-C  dextromethorphan-guaiFENesin [facility] DM) 30-600 MG per 12 hr tablet Take 1 tablet by mouth every 12 (twelve) hours.    Historical Provider, MD  HYDROcodone-acetaminophen (NORCO/VICODIN) 5-325 MG per tablet Take 2 tablets by mouth every 4 (four) hours as needed. 05/24/14   05/26/14, PA-C  Multiple Vitamin (MULTIVITAMIN) tablet Take 1 tablet by mouth daily.    Historical Provider, MD  oxyCODONE-acetaminophen (PERCOCET/ROXICET) 5-325 MG per tablet Take 1-2 tablets by mouth every 6 (six) hours as needed for severe pain. 07/05/13   09/04/13, MD  predniSONE (DELTASONE) 20 MG tablet 3 tabs po day one, then 2 tabs daily x 4 days 02/12/13   02/14/13, MD  predniSONE (DELTASONE) 20 MG tablet 3 tabs po day one, then 2 tabs daily x 4 days 02/12/13   02/14/13, MD    Family History Family History  Problem Relation Age of Onset  . Hypertension Maternal Grandmother   . Anemia Mother   . Thyroid disease Maternal Aunt     Social History Social History  Substance Use Topics  . Smoking  status: Current Every Day Smoker    Packs/day: 0.50    Types: Cigarettes  . Smokeless tobacco: Never Used     Comment: Trying to quit smoking.  Quit on 10/25/2011  . Alcohol use Yes     Comment: occ     Allergies   Patient has no known allergies.   Review of Systems Review of Systems  Musculoskeletal: Positive for arthralgias, gait problem and joint swelling.  Neurological: Negative for weakness and numbness.     Physical Exam Updated Vital Signs BP 107/77   Pulse 86   Temp 97.9 F (36.6 C) (Oral)   Resp 18   Ht 5\' 6"  (1.676 m)   Wt 66.7 kg   SpO2 100%   BMI 23.73 kg/m   Physical Exam  Constitutional: She appears well-developed and well-nourished. No distress.  Cardiovascular: Normal rate and regular  rhythm.   No murmur heard. Pulmonary/Chest: Effort normal. No respiratory distress. She has no wheezes.  Abdominal: Soft. She exhibits no distension. There is no tenderness.  Musculoskeletal:  Pain over dorsal ankle. Decreased anterior drawer of left ankle. No tenderness over medial or lateral malleolus of left ankle. No tenderness over left cuboid or navicular. Muscle strength 5/5 in upper and lower extremities. Achilles intact bilaterally.  Neurological: No sensory deficit.  Skin: No rash noted.  Psychiatric: She has a normal mood and affect. Her behavior is normal.     ED Treatments / Results  Labs (all labs ordered are listed, but only abnormal results are displayed) Labs Reviewed - No data to display  EKG  EKG Interpretation None       Radiology Dg Foot Complete Left  Result Date: 03/12/2016 CLINICAL DATA:  Pain with weight-bearing EXAM: LEFT FOOT - COMPLETE 3+ VIEW COMPARISON:  None. FINDINGS: Frontal, oblique, and lateral views were obtained. There is soft tissue swelling dorsally. There is no fracture or dislocation. Joint spaces appear normal. No erosive change. IMPRESSION: Soft tissue swelling dorsally. No fracture or dislocation. No evident arthropathy. Electronically Signed   By: 03/14/2016 III M.D.   On: 03/12/2016 13:34    Procedures Procedures (including critical care time)  Medications Ordered in ED Medications - No data to display   Initial Impression / Assessment and Plan / ED Course  I have reviewed the triage vital signs and the nursing notes.  Pertinent labs & imaging results that were available during my care of the patient were reviewed by me and considered in my medical decision making (see chart for details).  Clinical Course   - Xray of left ankle without fracture, soft tissue swelling of dorsal left ankle noted.  Final Clinical Impressions(s) / ED Diagnoses   Final diagnoses:  Acute left ankle pain  Suspect Left ankle sprain. Xray  negative. Recommend rest, elevation, compression with ACE wrap, and NSAIDs. Weight bearing as tolerated. Follow up with PCP if no improvement.   New Prescriptions New Prescriptions   No medications on file     [facility], DO 03/12/16 1411    03/14/16, MD 03/14/16 1155

## 2016-05-14 DIAGNOSIS — N3001 Acute cystitis with hematuria: Secondary | ICD-10-CM | POA: Diagnosis not present

## 2016-11-08 DIAGNOSIS — M2550 Pain in unspecified joint: Secondary | ICD-10-CM | POA: Diagnosis not present

## 2016-11-08 DIAGNOSIS — M7989 Other specified soft tissue disorders: Secondary | ICD-10-CM | POA: Diagnosis not present

## 2016-11-08 DIAGNOSIS — M0579 Rheumatoid arthritis with rheumatoid factor of multiple sites without organ or systems involvement: Secondary | ICD-10-CM | POA: Diagnosis not present

## 2016-11-08 DIAGNOSIS — R5382 Chronic fatigue, unspecified: Secondary | ICD-10-CM | POA: Diagnosis not present

## 2016-12-02 DIAGNOSIS — M7989 Other specified soft tissue disorders: Secondary | ICD-10-CM | POA: Diagnosis not present

## 2016-12-02 DIAGNOSIS — M0579 Rheumatoid arthritis with rheumatoid factor of multiple sites without organ or systems involvement: Secondary | ICD-10-CM | POA: Diagnosis not present

## 2016-12-02 DIAGNOSIS — M2550 Pain in unspecified joint: Secondary | ICD-10-CM | POA: Diagnosis not present

## 2017-01-27 DIAGNOSIS — M2550 Pain in unspecified joint: Secondary | ICD-10-CM | POA: Diagnosis not present

## 2017-01-27 DIAGNOSIS — M7989 Other specified soft tissue disorders: Secondary | ICD-10-CM | POA: Diagnosis not present

## 2017-01-27 DIAGNOSIS — M0579 Rheumatoid arthritis with rheumatoid factor of multiple sites without organ or systems involvement: Secondary | ICD-10-CM | POA: Diagnosis not present

## 2017-02-11 DIAGNOSIS — Z2821 Immunization not carried out because of patient refusal: Secondary | ICD-10-CM | POA: Diagnosis not present

## 2017-02-11 DIAGNOSIS — Z13220 Encounter for screening for lipoid disorders: Secondary | ICD-10-CM | POA: Diagnosis not present

## 2017-02-11 DIAGNOSIS — L819 Disorder of pigmentation, unspecified: Secondary | ICD-10-CM | POA: Diagnosis not present

## 2017-02-11 DIAGNOSIS — Z0001 Encounter for general adult medical examination with abnormal findings: Secondary | ICD-10-CM | POA: Diagnosis not present

## 2017-02-11 DIAGNOSIS — Z5181 Encounter for therapeutic drug level monitoring: Secondary | ICD-10-CM | POA: Diagnosis not present

## 2017-02-11 DIAGNOSIS — Z124 Encounter for screening for malignant neoplasm of cervix: Secondary | ICD-10-CM | POA: Diagnosis not present

## 2017-03-29 DIAGNOSIS — M2550 Pain in unspecified joint: Secondary | ICD-10-CM | POA: Diagnosis not present

## 2017-03-29 DIAGNOSIS — M0579 Rheumatoid arthritis with rheumatoid factor of multiple sites without organ or systems involvement: Secondary | ICD-10-CM | POA: Diagnosis not present

## 2017-03-29 DIAGNOSIS — M7989 Other specified soft tissue disorders: Secondary | ICD-10-CM | POA: Diagnosis not present

## 2017-06-28 DIAGNOSIS — M0579 Rheumatoid arthritis with rheumatoid factor of multiple sites without organ or systems involvement: Secondary | ICD-10-CM | POA: Diagnosis not present

## 2017-06-28 DIAGNOSIS — M2550 Pain in unspecified joint: Secondary | ICD-10-CM | POA: Diagnosis not present

## 2017-06-28 DIAGNOSIS — Z79899 Other long term (current) drug therapy: Secondary | ICD-10-CM | POA: Diagnosis not present

## 2017-10-03 DIAGNOSIS — Z79899 Other long term (current) drug therapy: Secondary | ICD-10-CM | POA: Diagnosis not present

## 2017-10-03 DIAGNOSIS — M2550 Pain in unspecified joint: Secondary | ICD-10-CM | POA: Diagnosis not present

## 2017-10-03 DIAGNOSIS — M0579 Rheumatoid arthritis with rheumatoid factor of multiple sites without organ or systems involvement: Secondary | ICD-10-CM | POA: Diagnosis not present

## 2017-11-01 DIAGNOSIS — M25531 Pain in right wrist: Secondary | ICD-10-CM | POA: Diagnosis not present

## 2017-11-01 DIAGNOSIS — M25511 Pain in right shoulder: Secondary | ICD-10-CM | POA: Diagnosis not present

## 2017-12-08 DIAGNOSIS — Z79899 Other long term (current) drug therapy: Secondary | ICD-10-CM | POA: Diagnosis not present

## 2017-12-08 DIAGNOSIS — M2550 Pain in unspecified joint: Secondary | ICD-10-CM | POA: Diagnosis not present

## 2017-12-08 DIAGNOSIS — M0579 Rheumatoid arthritis with rheumatoid factor of multiple sites without organ or systems involvement: Secondary | ICD-10-CM | POA: Diagnosis not present

## 2018-02-07 ENCOUNTER — Encounter (HOSPITAL_BASED_OUTPATIENT_CLINIC_OR_DEPARTMENT_OTHER): Payer: Self-pay | Admitting: Emergency Medicine

## 2018-02-07 ENCOUNTER — Other Ambulatory Visit: Payer: Self-pay

## 2018-02-07 ENCOUNTER — Emergency Department (HOSPITAL_BASED_OUTPATIENT_CLINIC_OR_DEPARTMENT_OTHER)
Admission: EM | Admit: 2018-02-07 | Discharge: 2018-02-07 | Disposition: A | Discharge status: Home / Self Care | Payer: 59 | Attending: Emergency Medicine | Admitting: Emergency Medicine

## 2018-02-07 DIAGNOSIS — G5601 Carpal tunnel syndrome, right upper limb: Secondary | ICD-10-CM | POA: Diagnosis not present

## 2018-02-07 DIAGNOSIS — F17210 Nicotine dependence, cigarettes, uncomplicated: Secondary | ICD-10-CM | POA: Insufficient documentation

## 2018-02-07 DIAGNOSIS — Z79899 Other long term (current) drug therapy: Secondary | ICD-10-CM | POA: Diagnosis not present

## 2018-02-07 DIAGNOSIS — R202 Paresthesia of skin: Secondary | ICD-10-CM | POA: Diagnosis present

## 2018-02-07 HISTORY — DX: Endometriosis, unspecified: N80.9

## 2018-02-07 MED ORDER — IBUPROFEN 400 MG PO TABS
600.0000 mg | ORAL_TABLET | Freq: Once | ORAL | Status: AC
  Administered 2018-02-07: 600 mg via ORAL
  Filled 2018-02-07: qty 1

## 2018-02-07 NOTE — ED Triage Notes (Signed)
Numbness alternating with tingling in right arm x3 days.

## 2018-02-07 NOTE — ED Provider Notes (Signed)
[facility] EMERGENCY DEPARTMENT Provider Note   CSN: [id number] Arrival date & time: 02/07/18  1025     History   Chief Complaint Chief Complaint  Patient presents with  . Numbness/Tingling R Arm    HPI Heather Washington is a 32 y.o. female.  HPI Patient reports 3 days of pain and tingling down into her right second third and fourth fingers.  She reports radiation of that discomfort up through her right arm and up into her right upper arm.  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  No weakness of her right hand.  No recent injury to the neck.  No neck pain at this time.   Past Medical History:  Diagnosis Date  . Anemia   . Endometriosis   . Hx of migraines   . RA (rheumatoid arthritis) (HCC) 2013    Patient Active Problem List   Diagnosis Date Noted  . Endometriosis 05/12/2012  . Hx of migraines   . RA (rheumatoid arthritis) (HCC)     Past Surgical History:  Procedure Laterality Date  . OVARIAN CYST SURGERY    . WISDOM TOOTH EXTRACTION       OB History    Gravida  1   Para  1   Term      Preterm      AB      Living  1     SAB      TAB      Ectopic      Multiple      Live Births               Home Medications    Prior to Admission medications   Medication Sig Start Date End Date Taking? Authorizing Provider  ketoconazole (NIZORAL) 2 % shampoo Apply liberal amount of shampoo to affected area; rub in for a few minutes.  Let stand for 10 minutes then rinse.  Repeat daily for 1 week. 02/11/17  Yes [provider]  hydroxychloroquine (PLAQUENIL) 200 MG tablet TAKE 1 TABLET BY MOUTH TWICE A DAY WITH FOOD OR MILK FOR RHEUMATOID ARTHRITIS 01/02/18   [provider]  ibuprofen (ADVIL,MOTRIN) 800 MG tablet Take by mouth.    [provider]  medroxyPROGESTERone (DEPO-PROVERA) 150 MG/ML injection Inject 150 mg into the muscle every 3 (three) months.    [provider]  Multiple Vitamin  (MULTIVITAMIN) tablet Take 1 tablet by mouth daily.    [provider]  Georgeanna Harrison 125 MG/ML SOAJ  02/05/18   [provider]    Family History Family History  Problem Relation Age of Onset  . Hypertension Maternal Grandmother   . Anemia Mother   . Thyroid disease Maternal Aunt     Social History Social History   Tobacco Use  . Smoking status: Current Every Day Smoker    Packs/day: 0.50    Types: Cigarettes  . Smokeless tobacco: Never Used  Substance Use Topics  . Alcohol use: Yes    Comment: occ  . Drug use: No     Allergies   Patient has no known allergies.   Review of Systems Review of Systems  All other systems reviewed and are negative.    Physical Exam Updated Vital Signs BP 115/69 (BP Location: Right Arm)   Pulse 80   Temp 98.1 F (36.7 C) (Oral)   Resp 12   Ht 5\' 4"  (1.626 m)   Wt 79.4 kg   SpO2 100%  BMI 30.04 kg/m   Physical Exam  Constitutional: She is oriented to person, place, and time. She appears well-developed and well-nourished.  HENT:  Head: Normocephalic.  Eyes: EOM are normal.  Neck: Normal range of motion.  Pulmonary/Chest: Effort normal.  Abdominal: She exhibits no distension.  Musculoskeletal: Normal range of motion.  Normal grip strength right hand.  Normal right radial pulse.  Normal perfusion to all 5 fingertips of the right hand.  Full range of motion of the right wrist, right elbow, right shoulder.  Neurological: She is alert and oriented to person, place, and time.  Psychiatric: She has a normal mood and affect.  Nursing note and vitals reviewed.    ED Treatments / Results  Labs (all labs ordered are listed, but only abnormal results are displayed) Labs Reviewed - No data to display  EKG None  Radiology No results found.  Procedures Procedures (including critical care time)  Medications Ordered in ED Medications  ibuprofen (ADVIL,MOTRIN) tablet 600 mg (has no administration in time  range)     Initial Impression / Assessment and Plan / ED Course  I have reviewed the triage vital signs and the nursing notes.  Pertinent labs & imaging results that were available during my care of the patient were reviewed by me and considered in my medical decision making (see chart for details).     Symptoms consistent with carpal tunnel syndrome.  Discharged home in good condition.  Primary care follow-up.  Wrist splint. Final Clinical Impressions(s) / ED Diagnoses   Final diagnoses:  Carpal tunnel syndrome, right    ED Discharge Orders    None       Azalia Bilis, MD 02/07/18 1131

## 2018-02-15 DIAGNOSIS — Z6829 Body mass index (BMI) 29.0-29.9, adult: Secondary | ICD-10-CM | POA: Diagnosis not present

## 2018-02-15 DIAGNOSIS — Z01419 Encounter for gynecological examination (general) (routine) without abnormal findings: Secondary | ICD-10-CM | POA: Diagnosis not present

## 2018-02-15 DIAGNOSIS — R1032 Left lower quadrant pain: Secondary | ICD-10-CM | POA: Diagnosis not present

## 2018-02-15 DIAGNOSIS — R109 Unspecified abdominal pain: Secondary | ICD-10-CM | POA: Diagnosis not present

## 2018-03-13 DIAGNOSIS — M2550 Pain in unspecified joint: Secondary | ICD-10-CM | POA: Diagnosis not present

## 2018-03-13 DIAGNOSIS — Z79899 Other long term (current) drug therapy: Secondary | ICD-10-CM | POA: Diagnosis not present

## 2018-03-13 DIAGNOSIS — M0579 Rheumatoid arthritis with rheumatoid factor of multiple sites without organ or systems involvement: Secondary | ICD-10-CM | POA: Diagnosis not present

## 2018-05-24 DIAGNOSIS — Z3202 Encounter for pregnancy test, result negative: Secondary | ICD-10-CM | POA: Diagnosis not present

## 2018-05-24 DIAGNOSIS — N809 Endometriosis, unspecified: Secondary | ICD-10-CM | POA: Diagnosis not present

## 2018-06-23 DIAGNOSIS — M2550 Pain in unspecified joint: Secondary | ICD-10-CM | POA: Diagnosis not present

## 2018-06-23 DIAGNOSIS — Z79899 Other long term (current) drug therapy: Secondary | ICD-10-CM | POA: Diagnosis not present

## 2018-06-23 DIAGNOSIS — M0579 Rheumatoid arthritis with rheumatoid factor of multiple sites without organ or systems involvement: Secondary | ICD-10-CM | POA: Diagnosis not present

## 2018-07-20 DIAGNOSIS — B373 Candidiasis of vulva and vagina: Secondary | ICD-10-CM | POA: Diagnosis not present

## 2018-08-11 DIAGNOSIS — Z3042 Encounter for surveillance of injectable contraceptive: Secondary | ICD-10-CM | POA: Diagnosis not present

## 2018-10-10 DIAGNOSIS — M2550 Pain in unspecified joint: Secondary | ICD-10-CM | POA: Diagnosis not present

## 2018-10-10 DIAGNOSIS — M0579 Rheumatoid arthritis with rheumatoid factor of multiple sites without organ or systems involvement: Secondary | ICD-10-CM | POA: Diagnosis not present

## 2018-10-10 DIAGNOSIS — Z79899 Other long term (current) drug therapy: Secondary | ICD-10-CM | POA: Diagnosis not present

## 2018-11-01 DIAGNOSIS — Z3042 Encounter for surveillance of injectable contraceptive: Secondary | ICD-10-CM | POA: Diagnosis not present

## 2018-12-05 DIAGNOSIS — G5622 Lesion of ulnar nerve, left upper limb: Secondary | ICD-10-CM | POA: Diagnosis not present

## 2018-12-05 DIAGNOSIS — R200 Anesthesia of skin: Secondary | ICD-10-CM | POA: Diagnosis not present

## 2019-01-02 DIAGNOSIS — G5622 Lesion of ulnar nerve, left upper limb: Secondary | ICD-10-CM | POA: Diagnosis not present

## 2019-01-10 DIAGNOSIS — M2550 Pain in unspecified joint: Secondary | ICD-10-CM | POA: Diagnosis not present

## 2019-01-10 DIAGNOSIS — M0579 Rheumatoid arthritis with rheumatoid factor of multiple sites without organ or systems involvement: Secondary | ICD-10-CM | POA: Diagnosis not present

## 2019-01-10 DIAGNOSIS — Z79899 Other long term (current) drug therapy: Secondary | ICD-10-CM | POA: Diagnosis not present

## 2019-01-17 DIAGNOSIS — Z3042 Encounter for surveillance of injectable contraceptive: Secondary | ICD-10-CM | POA: Diagnosis not present

## 2019-01-22 DIAGNOSIS — G5622 Lesion of ulnar nerve, left upper limb: Secondary | ICD-10-CM | POA: Diagnosis not present

## 2019-01-31 DIAGNOSIS — G5622 Lesion of ulnar nerve, left upper limb: Secondary | ICD-10-CM | POA: Diagnosis not present

## 2019-03-07 DIAGNOSIS — Z6833 Body mass index (BMI) 33.0-33.9, adult: Secondary | ICD-10-CM | POA: Diagnosis not present

## 2019-03-07 DIAGNOSIS — Z1151 Encounter for screening for human papillomavirus (HPV): Secondary | ICD-10-CM | POA: Diagnosis not present

## 2019-03-07 DIAGNOSIS — Z01419 Encounter for gynecological examination (general) (routine) without abnormal findings: Secondary | ICD-10-CM | POA: Diagnosis not present

## 2019-04-16 DIAGNOSIS — M2550 Pain in unspecified joint: Secondary | ICD-10-CM | POA: Diagnosis not present

## 2019-04-16 DIAGNOSIS — Z79899 Other long term (current) drug therapy: Secondary | ICD-10-CM | POA: Diagnosis not present

## 2019-04-16 DIAGNOSIS — M0579 Rheumatoid arthritis with rheumatoid factor of multiple sites without organ or systems involvement: Secondary | ICD-10-CM | POA: Diagnosis not present

## 2019-04-16 DIAGNOSIS — Z3042 Encounter for surveillance of injectable contraceptive: Secondary | ICD-10-CM | POA: Diagnosis not present

## 2019-07-16 DIAGNOSIS — M2550 Pain in unspecified joint: Secondary | ICD-10-CM | POA: Diagnosis not present

## 2019-07-16 DIAGNOSIS — M0579 Rheumatoid arthritis with rheumatoid factor of multiple sites without organ or systems involvement: Secondary | ICD-10-CM | POA: Diagnosis not present

## 2019-07-16 DIAGNOSIS — Z79899 Other long term (current) drug therapy: Secondary | ICD-10-CM | POA: Diagnosis not present

## 2019-10-22 DIAGNOSIS — M0579 Rheumatoid arthritis with rheumatoid factor of multiple sites without organ or systems involvement: Secondary | ICD-10-CM | POA: Diagnosis not present

## 2019-10-22 DIAGNOSIS — Z3200 Encounter for pregnancy test, result unknown: Secondary | ICD-10-CM | POA: Diagnosis not present

## 2019-10-22 DIAGNOSIS — L0231 Cutaneous abscess of buttock: Secondary | ICD-10-CM | POA: Diagnosis not present

## 2019-10-22 DIAGNOSIS — Z79899 Other long term (current) drug therapy: Secondary | ICD-10-CM | POA: Diagnosis not present

## 2019-10-22 DIAGNOSIS — M2550 Pain in unspecified joint: Secondary | ICD-10-CM | POA: Diagnosis not present

## 2019-10-22 DIAGNOSIS — Z3042 Encounter for surveillance of injectable contraceptive: Secondary | ICD-10-CM | POA: Diagnosis not present

## 2019-10-30 DIAGNOSIS — L0231 Cutaneous abscess of buttock: Secondary | ICD-10-CM | POA: Diagnosis not present

## 2019-12-12 DIAGNOSIS — L0231 Cutaneous abscess of buttock: Secondary | ICD-10-CM | POA: Diagnosis not present

## 2019-12-19 DIAGNOSIS — L0231 Cutaneous abscess of buttock: Secondary | ICD-10-CM | POA: Diagnosis not present

## 2020-01-17 DIAGNOSIS — Z3042 Encounter for surveillance of injectable contraceptive: Secondary | ICD-10-CM | POA: Diagnosis not present

## 2020-02-13 DIAGNOSIS — Z79899 Other long term (current) drug therapy: Secondary | ICD-10-CM | POA: Diagnosis not present

## 2020-02-13 DIAGNOSIS — M2550 Pain in unspecified joint: Secondary | ICD-10-CM | POA: Diagnosis not present

## 2020-02-13 DIAGNOSIS — M0579 Rheumatoid arthritis with rheumatoid factor of multiple sites without organ or systems involvement: Secondary | ICD-10-CM | POA: Diagnosis not present

## 2020-03-06 DIAGNOSIS — Z6831 Body mass index (BMI) 31.0-31.9, adult: Secondary | ICD-10-CM | POA: Diagnosis not present

## 2020-03-06 DIAGNOSIS — Z01419 Encounter for gynecological examination (general) (routine) without abnormal findings: Secondary | ICD-10-CM | POA: Diagnosis not present

## 2020-03-27 DIAGNOSIS — Z3042 Encounter for surveillance of injectable contraceptive: Secondary | ICD-10-CM | POA: Diagnosis not present

## 2020-04-03 DIAGNOSIS — Z0000 Encounter for general adult medical examination without abnormal findings: Secondary | ICD-10-CM | POA: Diagnosis not present

## 2020-04-03 DIAGNOSIS — Z13220 Encounter for screening for lipoid disorders: Secondary | ICD-10-CM | POA: Diagnosis not present
